# Patient Record
Sex: Female | Born: 1965 | Race: White | Hispanic: No | Smoking: Never smoker
Health system: Southern US, Community
[De-identification: ages and names within clinical notes are randomized; demographics above are authoritative.]

## PROBLEM LIST (undated history)

## (undated) DIAGNOSIS — R112 Nausea with vomiting, unspecified: Secondary | ICD-10-CM

## (undated) DIAGNOSIS — D229 Melanocytic nevi, unspecified: Secondary | ICD-10-CM

## (undated) DIAGNOSIS — D649 Anemia, unspecified: Secondary | ICD-10-CM

## (undated) DIAGNOSIS — M797 Fibromyalgia: Secondary | ICD-10-CM

## (undated) HISTORY — DX: Anemia, unspecified: D64.9

## (undated) HISTORY — DX: Fibromyalgia: M79.7

## (undated) HISTORY — PX: TYMPANOSTOMY TUBE PLACEMENT: SHX32

## (undated) HISTORY — DX: Other specified postprocedural states: R11.2

## (undated) HISTORY — PX: APPENDECTOMY: SHX54

---
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
## 1999-10-17 HISTORY — PX: CERVICAL CONE BIOPSY: SUR198

## 1999-11-07 ENCOUNTER — Encounter: Payer: Self-pay | Admitting: Internal Medicine

## 1999-11-07 ENCOUNTER — Ambulatory Visit (HOSPITAL_COMMUNITY): Admission: RE | Admit: 1999-11-07 | Discharge: 1999-11-07 | Payer: Self-pay | Admitting: Internal Medicine

## 2000-06-04 ENCOUNTER — Other Ambulatory Visit: Admission: RE | Admit: 2000-06-04 | Discharge: 2000-06-04 | Payer: Self-pay | Admitting: Obstetrics and Gynecology

## 2000-06-04 ENCOUNTER — Encounter (INDEPENDENT_AMBULATORY_CARE_PROVIDER_SITE_OTHER): Payer: Self-pay

## 2000-07-05 ENCOUNTER — Ambulatory Visit (HOSPITAL_COMMUNITY): Admission: RE | Admit: 2000-07-05 | Discharge: 2000-07-05 | Payer: Self-pay | Admitting: Obstetrics and Gynecology

## 2000-07-05 ENCOUNTER — Encounter (INDEPENDENT_AMBULATORY_CARE_PROVIDER_SITE_OTHER): Payer: Self-pay | Admitting: Specialist

## 2001-03-06 ENCOUNTER — Other Ambulatory Visit: Admission: RE | Admit: 2001-03-06 | Discharge: 2001-03-06 | Payer: Self-pay | Admitting: Obstetrics and Gynecology

## 2001-03-06 ENCOUNTER — Encounter (INDEPENDENT_AMBULATORY_CARE_PROVIDER_SITE_OTHER): Payer: Self-pay | Admitting: Specialist

## 2002-06-09 ENCOUNTER — Ambulatory Visit (HOSPITAL_COMMUNITY): Admission: RE | Admit: 2002-06-09 | Discharge: 2002-06-09 | Payer: Self-pay | Admitting: Internal Medicine

## 2002-06-09 ENCOUNTER — Encounter: Payer: Self-pay | Admitting: Internal Medicine

## 2002-06-10 ENCOUNTER — Observation Stay (HOSPITAL_COMMUNITY): Admission: EM | Admit: 2002-06-10 | Discharge: 2002-06-11 | Payer: Self-pay | Admitting: Emergency Medicine

## 2002-06-10 ENCOUNTER — Encounter (INDEPENDENT_AMBULATORY_CARE_PROVIDER_SITE_OTHER): Payer: Self-pay

## 2008-02-28 LAB — CONVERTED CEMR LAB: Pap Smear: NORMAL

## 2008-03-06 ENCOUNTER — Ambulatory Visit: Payer: Self-pay | Admitting: Internal Medicine

## 2008-03-06 DIAGNOSIS — M7710 Lateral epicondylitis, unspecified elbow: Secondary | ICD-10-CM | POA: Insufficient documentation

## 2008-05-27 ENCOUNTER — Encounter: Payer: Self-pay | Admitting: Internal Medicine

## 2008-07-27 ENCOUNTER — Encounter: Admission: RE | Admit: 2008-07-27 | Discharge: 2008-07-27 | Payer: Self-pay | Admitting: Obstetrics and Gynecology

## 2009-02-13 LAB — CONVERTED CEMR LAB: Pap Smear: NORMAL

## 2009-07-19 ENCOUNTER — Encounter: Admission: RE | Admit: 2009-07-19 | Discharge: 2009-07-19 | Payer: Self-pay | Admitting: Obstetrics and Gynecology

## 2010-01-31 ENCOUNTER — Telehealth: Payer: Self-pay | Admitting: Internal Medicine

## 2010-02-01 ENCOUNTER — Ambulatory Visit: Payer: Self-pay | Admitting: Internal Medicine

## 2010-02-10 ENCOUNTER — Encounter: Admission: RE | Admit: 2010-02-10 | Discharge: 2010-02-10 | Payer: Self-pay | Admitting: Internal Medicine

## 2010-02-14 ENCOUNTER — Telehealth: Payer: Self-pay | Admitting: Internal Medicine

## 2010-11-15 NOTE — Assessment & Plan Note (Signed)
Summary: chest pain on the right side moved to back and then stomach-lb   Vital Signs:  Patient profile:   45 year old female Height:      63 inches (160.02 cm) Weight:      115.25 pounds (52.39 kg) BMI:     20.49 O2 Sat:      98 % on Room air Temp:     98.3 degrees F (36.83 degrees C) oral Pulse rate:   83 / minute Pulse rhythm:   regular BP sitting:   110 / 64  (left arm) Cuff size:   regular  Vitals Entered By: Brenton Grills (February 01, 2010 4:35 PM)  O2 Flow:  Room air CC: pt states she had upper back pain and chest tightening last Friday, no chest pain, upper back pain 7/10, pt also c/o upper abdominal pain/also wants to discuss other problems/aj   Primary Care Provider:  [facility]  CC:  pt states she had upper back pain and chest tightening last Friday, no chest pain, upper back pain 7/10, and pt also c/o upper abdominal pain/also wants to discuss other problems/aj.  History of Present Illness: Friday had pain on the right of her chest which then later radiated to her back accompanied by nausea. This lasted all day. No appetite. Saturday sick all day: with buring pain in the epigastric. No left chest, no diaphoresis, no SOB. No fatty food intolerance. She did have light/mustard colored stools. No exertional chest pain. She did have a similar bout of discomfort 7 years ago.   Current Medications (verified): 1)  Ortho-Cyclen (28) 0.25-35 Mg-Mcg  Tabs (Norgestimate-Eth Estradiol) .... As Directed  Allergies (verified): 1)  ! Macrobid  Past History:  Past Medical History: Last updated: 03/06/2008 Porfirio Mylar  Past Surgical History: Last updated: 03/06/2008 Appendectomy Tympanostomy tubes as child conization-for cervical atypia '01  Family History: Last updated: 03/06/2008 father- '45: HTN mother - '47: Lipids Neg- breast or colon cancer, DM, CAD  Social History: Last updated: 03/06/2008 HSG married '87 - 10 years divorced 2 daughters - '88, '94 work: Product/process development scientist  for [employer]  Risk Factors: Caffeine Use: 1 (03/06/2008) Exercise: yes (03/06/2008)  Review of Systems       The patient complains of abdominal pain.  The patient denies anorexia, fever, weight loss, weight gain, decreased hearing, hoarseness, chest pain, dyspnea on exertion, prolonged cough, melena, incontinence, difficulty walking, and enlarged lymph nodes.    Physical Exam  General:  Slender white female in no distress Head:  normocephalic and atraumatic.   Eyes:  corneas and lenses clear and no injection.  No jaundice Neck:  supple.   Lungs:  Normal respiratory effort, chest expands symmetrically. Lungs are clear to auscultation, no crackles or wheezes. Heart:  Normal rate and regular rhythm. S1 and S2 normal without gallop, murmur, click, rub or other extra sounds. Abdomen:  soft, non-tender, normal bowel sounds, no distention, no guarding, no rigidity, and no hepatomegaly.  No palpable GB bulb.   Impression & Recommendations:  Problem # 1:  ABDOMINAL PAIN RIGHT UPPER QUADRANT (ICD-789.01)  Patient reassured that this does not sound like cardiac symptoms. I am concerned for biliary colic: female, 40's, right location and radiation; had some mustard stools.  Plan - GB U/S  Orders: Radiology Referral (Radiology)  Complete Medication List: 1)  Ortho-cyclen (28) 0.25-35 Mg-mcg Tabs (Norgestimate-eth estradiol) .... As directed   Preventive Care Screening  Mammogram:    Date:  11/16/2009    Results:  normal   Pap Smear:    Date:  02/13/2009    Results:  normal     Immunization History:  Tetanus/Td Immunization History:    Tetanus/Td:  historical (10/16/2002)

## 2010-11-15 NOTE — Progress Notes (Signed)
Summary: NEEDS APT  Phone Note Call from Patient Call back at [phone]   Summary of Call: Patient is requesting a call back regarding upper back pain and abd pain. Wants to know if she needs office visit.  Initial call taken by: Lamar Sprinkles, CMA,  January 31, 2010 9:32 AM  Follow-up for Phone Call        Pt c/o right sided upper abd pain which started friday night. Pain resolved and pt became nauseated and then had diarrhea. Continues to have some loose stool, approx 2 daily but continues to get better.  Advised bland diet and office visit for eval if she did not continue to improve.  Follow-up by: Lamar Sprinkles, CMA,  January 31, 2010 2:35 PM  Additional Follow-up for Phone Call Additional follow up Details #1::        ov if she doesn't get better Additional Follow-up by: Jacques Navy MD,  January 31, 2010 5:21 PM

## 2010-11-15 NOTE — Progress Notes (Signed)
  Phone Note Outgoing Call   Reason for Call: Discuss lab or test results Summary of Call: please call: normal Abdominal U/S  Thaks Initial call taken by: Jacques Navy MD,  Feb 14, 2010 5:16 AM  Follow-up for Phone Call        lmoam for pt to return call Follow-up by: Ami Bullins CMA,  Feb 14, 2010 3:08 PM  Additional Follow-up for Phone Call Additional follow up Details #1::        informed pt of results Additional Follow-up by: Ami Bullins CMA,  Feb 15, 2010 8:59 AM

## 2011-03-03 NOTE — Op Note (Signed)
Melinda Wilkinson, Melinda Wilkinson NO.:  [id number]   MEDICAL RECORD NO.:  [id number]                   PATIENT TYPE:  OBV   LOCATION:  0357                                 FACILITY:  [facility]   PHYSICIAN:  Skeet Simmer., M.D.         DATE OF BIRTH:  [birth year]   DATE OF PROCEDURE:  06/10/2002  DATE OF DISCHARGE:  06/11/2002                                 OPERATIVE REPORT   PREOPERATIVE DIAGNOSIS:  Acute appendicitis.   POSTOPERATIVE DIAGNOSIS:  Acute appendicitis.   OPERATION:  Laparoscopic appendectomy.   SURGEON:  Zigmund Daniel, M.D.   ANESTHESIA:  General.   CLINICAL NOTE:  The patient is a 45 year old white female, who had a two day  history of generalized abdominal pain localizing to the right lower  quadrant.  Although her white count was normal and she did not have fever,  she was tender in the right lower quadrant, and so a CT scan was done  showing findings typical for acute appendicitis.  The patient agreed to have  a laparoscopic appendectomy.   DESCRIPTION OF PROCEDURE:  After the patient was monitored and anesthetized,  had a Foley catheter and routine preparation and draping of the abdomen, I  anesthetized port sites just below the umbilicus in the lower midline a few  centimeters below that and in the right upper quadrant.  I put in a Hasson  cannula through a small incision just below the umbilicus after opening the  fascia in the midline and bluntly entering the peritoneal cavity.  I put in  a 10/11 mm port in the lower midline and a 5 mm port in the right upper  quadrant.  There was a small amount of bloody fluid present in the right  lower quadrant.  I could see the base of the appendix and noted that the tip  of the appendix and actually most of the appendix was in the retrocecal,  retroperitoneal position.  I incised the peritoneum near it and dissected it  free and noted that it was acutely inflamed.  It was healthy at the  base.  The mesoappendix and lateral attachments were fairly broad-based and  required a good deal of dissection to bring down.  I stapled the  mesoappendix and the base of the appendix with separate firings of the  endoscopic stapler.  I was not quite sure that I completely stapled across  the appendix at the cecum, and so I fired an extra load of the stapler, and  then there was obviously secure amputation of the appendix.  I placed the  appendix into a plastic pouch and put it in the pelvis.  I then irrigated  the right lower quadrant and removed the irrigant.  I found a couple of  small bleeders and cauterized those and thereafter hemostasis was good.  After removing as much irrigant as I could, I removed the appendix through  the  umbilical incision and tied the pursestring suture.  I then removed the  right upper quadrant port under direct vision and removed the lower midline  port after allowing the CO2 to escape.  The patient tolerated the operation  well.  I closed the skin incisions with intracuticular 4-0 Vicryl and Steri-  Strips.                                               Skeet Simmer., M.D.    Elvis Coil  D:  06/10/2002  T:  06/12/2002  Job:  [id number]

## 2011-03-03 NOTE — Op Note (Signed)
[facility]  Patient:    VALENA, IVANOV                       MRN: [id number] Proc. Date: 07/05/00 Adm. Date:  [id number] Disc. Date: [id number] Attending:  Lendon Colonel                           Operative Report  PREOPERATIVE DIAGNOSIS:       Carcinoma in situ of cervix.  POSTOPERATIVE DIAGNOSIS:      Carcinoma in situ of cervix.  OPERATION:                    Cold knife conization.  DESCRIPTION OF PROCEDURE:     The patient was placed in lithotomy position and prepped and draped in the usual fashion.  Cervix grasped with a tenaculum, injected with about 10 cc of 0.5% Xylocaine with epinephrine.  Two lateral hemostatic sutures were placed in the cervix and a wide, sharp conization of the cervix was obtained.  The cervix was then closed with interrupted sutures of 0 Vicryl.  An endocervical curettage had been accomplished after the conization was removed.  Hemostasis was secure.  Patty tolerated this procedure well and was sent to recovery in good condition. DD:  07/05/00 TD:  07/07/00 Job: [id number]

## 2011-04-07 ENCOUNTER — Encounter: Payer: Self-pay | Admitting: Internal Medicine

## 2011-04-07 ENCOUNTER — Ambulatory Visit (INDEPENDENT_AMBULATORY_CARE_PROVIDER_SITE_OTHER): Payer: Managed Care, Other (non HMO) | Admitting: Internal Medicine

## 2011-04-07 VITALS — BP 128/78 | HR 99 | Temp 98.6°F | Wt 116.0 lb

## 2011-04-07 DIAGNOSIS — R1013 Epigastric pain: Secondary | ICD-10-CM

## 2011-04-07 NOTE — Progress Notes (Signed)
  Subjective:    Patient ID: Melinda Wilkinson, female    DOB: 06/13/1966, 45 y.o.   MRN: [id number]  HPI Ms. Abramo presents for a month long h/o abdominal pain n the upper abdomen with some radiation to the back. Her symptoms are exacerbated by eating. In the last 24 hours she has had an episode of increased pain,a change in radiation to the upper back, chills, nauseas and flu like feeling. She had an episode about a year ago that was similar. She had a GI workup with GB u/s that was negative for stone or other abnormality. No change in bowel habit or stools. She has not taken any otc medications for her discomfort.   No past medical history on file. Past Surgical History  Procedure Date  . Appendectomy   . Tympanostomy tube placement as child  . Cervical cone biopsy 2001    for cervical atypia   Family History  Problem Relation Age of Onset  . Hyperlipidemia Mother   . Hypertension Father   . Cancer Neg Hx     breast or colon  . Diabetes Neg Hx   . Coronary artery disease Neg Hx    History   Social History  . Marital Status: Married    Spouse Name: N/A    Number of Children: N/A  . Years of Education: N/A   Occupational History  . auditor [employer]   Social History Main Topics  . Smoking status: Not on file  . Smokeless tobacco: Not on file  . Alcohol Use:   . Drug Use:   . Sexually Active:    Other Topics Concern  . Not on file   Social History Narrative   HSGMarried [id number] 10 yrs divorced       Review of Systems  Constitutional: Negative.   HENT: Negative.   Respiratory: Negative.   Cardiovascular: Negative.   Gastrointestinal: Positive for abdominal pain. Negative for vomiting, diarrhea, constipation and blood in stool.  Genitourinary: Negative.   Musculoskeletal: Negative.   Psychiatric/Behavioral: Negative.        Objective:   Physical Exam Vital noted Gen'l- WNWD woman in no distress Abdomen - no tenderness RUQ to palpation or percussion.  Tender int he epigastrum. No lower quadrant tenderness       Assessment & Plan:  Abdominal pain - previous work-up negative for GB disease. Symptoms do not suggest biliary colic but are c/w acid related dyspepsia.  Plan - trial of PPI therapy - samples of nexium provided. She is to report back as to effect - Rx will be provided if helpful

## 2011-04-10 ENCOUNTER — Ambulatory Visit: Payer: Self-pay | Admitting: Internal Medicine

## 2011-11-16 ENCOUNTER — Other Ambulatory Visit: Payer: Self-pay | Admitting: Family Medicine

## 2011-11-16 ENCOUNTER — Other Ambulatory Visit (HOSPITAL_COMMUNITY)
Admission: RE | Admit: 2011-11-16 | Discharge: 2011-11-16 | Disposition: A | Discharge status: Home / Self Care | Payer: Managed Care, Other (non HMO) | Source: Ambulatory Visit | Attending: Family Medicine | Admitting: Family Medicine

## 2011-11-16 DIAGNOSIS — Z124 Encounter for screening for malignant neoplasm of cervix: Secondary | ICD-10-CM | POA: Insufficient documentation

## 2011-11-16 DIAGNOSIS — Z1159 Encounter for screening for other viral diseases: Secondary | ICD-10-CM | POA: Insufficient documentation

## 2012-04-30 ENCOUNTER — Emergency Department (HOSPITAL_BASED_OUTPATIENT_CLINIC_OR_DEPARTMENT_OTHER)
Admission: EM | Admit: 2012-04-30 | Discharge: 2012-04-30 | Disposition: A | Discharge status: Home / Self Care | Payer: Managed Care, Other (non HMO) | Attending: Emergency Medicine | Admitting: Emergency Medicine

## 2012-04-30 ENCOUNTER — Emergency Department (HOSPITAL_BASED_OUTPATIENT_CLINIC_OR_DEPARTMENT_OTHER): Payer: Managed Care, Other (non HMO)

## 2012-04-30 ENCOUNTER — Encounter (HOSPITAL_BASED_OUTPATIENT_CLINIC_OR_DEPARTMENT_OTHER): Payer: Self-pay | Admitting: Family Medicine

## 2012-04-30 DIAGNOSIS — R102 Pelvic and perineal pain: Secondary | ICD-10-CM

## 2012-04-30 DIAGNOSIS — N949 Unspecified condition associated with female genital organs and menstrual cycle: Secondary | ICD-10-CM | POA: Insufficient documentation

## 2012-04-30 DIAGNOSIS — R1032 Left lower quadrant pain: Secondary | ICD-10-CM | POA: Insufficient documentation

## 2012-04-30 LAB — CBC WITH DIFFERENTIAL/PLATELET
Basophils Absolute: 0 10*3/uL (ref 0.0–0.1)
Eosinophils Absolute: 0.1 10*3/uL (ref 0.0–0.7)
Eosinophils Relative: 1 % (ref 0–5)
Lymphocytes Relative: 19 % (ref 12–46)
Lymphs Abs: 1.1 10*3/uL (ref 0.7–4.0)
MCH: 30.1 pg (ref 26.0–34.0)
MCV: 86.9 fL (ref 78.0–100.0)
Neutrophils Relative %: 74 % (ref 43–77)
Platelets: 260 10*3/uL (ref 150–400)
RBC: 3.75 MIL/uL — ABNORMAL LOW (ref 3.87–5.11)
RDW: 13.1 % (ref 11.5–15.5)
WBC: 5.9 10*3/uL (ref 4.0–10.5)

## 2012-04-30 LAB — URINALYSIS, ROUTINE W REFLEX MICROSCOPIC
Bilirubin Urine: NEGATIVE
Leukocytes, UA: NEGATIVE
Nitrite: NEGATIVE
Specific Gravity, Urine: 1.021 (ref 1.005–1.030)
Urobilinogen, UA: 0.2 mg/dL (ref 0.0–1.0)
pH: 6 (ref 5.0–8.0)

## 2012-04-30 LAB — BASIC METABOLIC PANEL
Calcium: 9.3 mg/dL (ref 8.4–10.5)
GFR calc non Af Amer: >90 mL/min (ref >90)
Potassium: 4 mEq/L (ref 3.5–5.1)
Sodium: 138 mEq/L (ref 135–145)

## 2012-04-30 LAB — URINE MICROSCOPIC-ADD ON

## 2012-04-30 LAB — WET PREP, GENITAL
Clue Cells Wet Prep HPF POC: NONE SEEN
Trich, Wet Prep: NONE SEEN
Yeast Wet Prep HPF POC: NONE SEEN

## 2012-04-30 MED ORDER — OXYCODONE-ACETAMINOPHEN 5-325 MG PO TABS: 1.0000 | ORAL_TABLET | ORAL | Status: AC | PRN

## 2012-04-30 MED ORDER — KETOROLAC TROMETHAMINE 30 MG/ML IJ SOLN
30.0000 mg | Freq: Once | INTRAMUSCULAR | Status: AC
  Administered 2012-04-30: 30 mg via INTRAVENOUS
  Filled 2012-04-30: qty 1

## 2012-04-30 MED ORDER — NAPROXEN 500 MG PO TABS: 500.0000 mg | ORAL_TABLET | Freq: Two times a day (BID) | ORAL | Status: AC

## 2012-04-30 NOTE — ED Provider Notes (Signed)
History     CSN: [id number]  Arrival date & time 04/30/12  4098   First MD Initiated Contact with Patient 04/30/12 1002      Chief Complaint  Patient presents with  . Abdominal Pain    (Consider location/radiation/quality/duration/timing/severity/associated sxs/prior treatment) Patient is a 46 y.o. female presenting with abdominal pain. The history is provided by the patient.  Abdominal Pain The primary symptoms of the illness include abdominal pain.  She has been having left suprapubic pain for the last 2 months. Pain waxes and wanes and at times is completely gone. There is intermittent radiation to her lower back. Pain is worse if she flexes her hip joints and better she lays still. Is also better in the middle of the day and tends to be worse at night. It seems to be worse at around the time of her menstrual period. Her menses started yesterday, and last night, pain was severe. She rated pain at 10/10 last night, but it has improved to and is only 4/10 currently. She is treated herself with ibuprofen which does give partial relief. They're sometimes associated nausea, but she has not had any vomiting. She denies fever, chills, sweats. She denies constipation or diarrhea. She's not had any urinary difficulty and she denies any vaginal discharge. She is on birth control pills for contraception.  History reviewed. No pertinent past medical history.  Past Surgical History  Procedure Date  . Appendectomy   . Tympanostomy tube placement as child  . Cervical cone biopsy 2001    for cervical atypia    Family History  Problem Relation Age of Onset  . Hyperlipidemia Mother   . Hypertension Father   . Cancer Neg Hx     breast or colon  . Diabetes Neg Hx   . Coronary artery disease Neg Hx     History  Substance Use Topics  . Smoking status: Never Smoker   . Smokeless tobacco: Not on file  . Alcohol Use: No    OB History    Grav Para Term Preterm Abortions TAB SAB Ect Mult  Living                  Review of Systems  Gastrointestinal: Positive for abdominal pain.  All other systems reviewed and are negative.    Allergies  Nitrofurantoin  Home Medications   Current Outpatient Rx  Name Route Sig Dispense Refill  . NORGESTIMATE-ETH ESTRADIOL 0.25-35 MG-MCG PO TABS Oral Take 1 tablet by mouth daily.        BP 126/86  Pulse 113  Temp 98.5 F (36.9 C) (Oral)  Resp 18  Ht 5\' 3"  (1.6 m)  Wt 119 lb (53.978 kg)  BMI 21.08 kg/m2  SpO2 100%  LMP 04/29/2012  Physical Exam  Nursing note and vitals reviewed.  46 year old female who is resting comfortably and in no acute distress. Vital signs are significant for tachycardia with heart rate 113. Oxygen saturation is 100% which is normal. Head is normocephalic and atraumatic. PERRLA, EOMI. Oropharynx is clear. Neck is nontender and supple. Back is nontender there's no CVA tenderness. Lungs are clear without rales, wheezes, rhonchi. Heart has regular rate and rhythm without murmur. Abdomen is soft, flat, with mild left suprapubic tenderness. There is no rebound or guarding. There no masses or hepatosplenomegaly. Peristalsis is normal active. Pelvic exam: Normal external female genitalia, small amount of menstrual flow present but no other discharge, fundus is normal size and position and nontender, there is no adnexal  masses, there is mild tenderness to palpation of the left adnexa, there is no cervical motion tenderness. Extremities have no cyanosis or edema, full range of motion is present. Skin is warm and dry without rash. Neurologic: Mental status is normal, cranial nerves are intact, there are no motor or sensory deficits.  ED Course  Procedures (including critical care time)  Results for orders placed during the hospital encounter of 04/30/12  URINALYSIS, ROUTINE W REFLEX MICROSCOPIC      Component Value Range   Color, Urine YELLOW  YELLOW   APPearance CLEAR  CLEAR   Specific Gravity, Urine 1.021  1.005 -  1.030   pH 6.0  5.0 - 8.0   Glucose, UA NEGATIVE  NEGATIVE mg/dL   Hgb urine dipstick LARGE (*) NEGATIVE   Bilirubin Urine NEGATIVE  NEGATIVE   Ketones, ur NEGATIVE  NEGATIVE mg/dL   Protein, ur NEGATIVE  NEGATIVE mg/dL   Urobilinogen, UA 0.2  0.0 - 1.0 mg/dL   Nitrite NEGATIVE  NEGATIVE   Leukocytes, UA NEGATIVE  NEGATIVE  PREGNANCY, URINE      Component Value Range   Preg Test, Ur NEGATIVE  NEGATIVE  CBC WITH DIFFERENTIAL      Component Value Range   WBC 5.9  4.0 - 10.5 K/uL   RBC 3.75 (*) 3.87 - 5.11 MIL/uL   Hemoglobin 11.3 (*) 12.0 - 15.0 g/dL   HCT 16.1 (*) 09.6 - 04.5 %   MCV 86.9  78.0 - 100.0 fL   MCH 30.1  26.0 - 34.0 pg   MCHC 34.7  30.0 - 36.0 g/dL   RDW 40.9  81.1 - 91.4 %   Platelets 260  150 - 400 K/uL   Neutrophils Relative 74  43 - 77 %   Neutro Abs 4.3  1.7 - 7.7 K/uL   Lymphocytes Relative 19  12 - 46 %   Lymphs Abs 1.1  0.7 - 4.0 K/uL   Monocytes Relative 6  3 - 12 %   Monocytes Absolute 0.4  0.1 - 1.0 K/uL   Eosinophils Relative 1  0 - 5 %   Eosinophils Absolute 0.1  0.0 - 0.7 K/uL   Basophils Relative 0  0 - 1 %   Basophils Absolute 0.0  0.0 - 0.1 K/uL  BASIC METABOLIC PANEL      Component Value Range   Sodium 138  135 - 145 mEq/L   Potassium 4.0  3.5 - 5.1 mEq/L   Chloride 103  96 - 112 mEq/L   CO2 25  19 - 32 mEq/L   Glucose, Bld 108 (*) 70 - 99 mg/dL   BUN 10  6 - 23 mg/dL   Creatinine, Ser 7.82  0.50 - 1.10 mg/dL   Calcium 9.3  8.4 - 95.6 mg/dL   GFR calc non Af Amer >90  >90 mL/min   GFR calc Af Amer >90  >90 mL/min  WET PREP, GENITAL      Component Value Range   Yeast Wet Prep HPF POC NONE SEEN  NONE SEEN   Trich, Wet Prep NONE SEEN  NONE SEEN   Clue Cells Wet Prep HPF POC NONE SEEN  NONE SEEN   WBC, Wet Prep HPF POC FEW (*) NONE SEEN  URINE MICROSCOPIC-ADD ON      Component Value Range   Squamous Epithelial / LPF FEW (*) RARE   WBC, UA 0-2  <3 WBC/hpf   RBC / HPF TOO NUMEROUS TO COUNT  <3 RBC/hpf  Bacteria, UA FEW (*) RARE   US  Transvaginal Non-ob  04/30/2012  *RADIOLOGY REPORT*  Clinical Data: Left lower quadrant pain  TRANSABDOMINAL AND TRANSVAGINAL ULTRASOUND OF PELVIS Technique:  Both transabdominal and transvaginal ultrasound examinations of the pelvis were performed. Transabdominal technique was performed for global imaging of the pelvis including uterus, ovaries, adnexal regions, and pelvic cul-de-sac.  It was necessary to proceed with endovaginal exam following the transabdominal exam to visualize the adnexa.  Comparison:  None  Findings:  The uterus is normal in size and echotexture, measuring 9.6 x 4.8 x 7.0 cm.  The endometrial stripe is thin and homogeneous, measuring 6 mm in width.  Both ovaries have a normal size and appearance with the right ovary measuring 2.3 x 1.7 x 2.0 cm, and the left ovary measuring 2.1 x 2.1 x 2.3 cm.  There are no adnexal masses or free pelvic fluid.  IMPRESSION: Normal study. No evidence of pelvic mass or other significant abnormality.  Original Report Authenticated By: Brandon Melnick, M.D.   US Pelvis Complete  04/30/2012  *RADIOLOGY REPORT*  Clinical Data: Left lower quadrant pain  TRANSABDOMINAL AND TRANSVAGINAL ULTRASOUND OF PELVIS Technique:  Both transabdominal and transvaginal ultrasound examinations of the pelvis were performed. Transabdominal technique was performed for global imaging of the pelvis including uterus, ovaries, adnexal regions, and pelvic cul-de-sac.  It was necessary to proceed with endovaginal exam following the transabdominal exam to visualize the adnexa.  Comparison:  None  Findings:  The uterus is normal in size and echotexture, measuring 9.6 x 4.8 x 7.0 cm.  The endometrial stripe is thin and homogeneous, measuring 6 mm in width.  Both ovaries have a normal size and appearance with the right ovary measuring 2.3 x 1.7 x 2.0 cm, and the left ovary measuring 2.1 x 2.1 x 2.3 cm.  There are no adnexal masses or free pelvic fluid.  IMPRESSION: Normal study. No evidence of  pelvic mass or other significant abnormality.  Original Report Authenticated By: Brandon Melnick, M.D.      1. Pelvic pain in female       MDM  Left pelvic pain in a pattern that seems more suggestive of endometriosis and ovarian cyst. Pelvic call ultrasound will be obtained.  Ultrasound is unremarkable. Chronic given the lack of findings on ultrasound, and a worsening of symptoms around her menses, I feel she most likely has endometriosis. She is started on birth control pills which should be helpful. She does have a gynecologist. She is encouraged to followup with her oncologist and she's given prescriptions for naproxen and Percocet.      Dione Booze, MD 04/30/12 [phone]

## 2012-04-30 NOTE — ED Notes (Signed)
Pt c/o "left ovary pain" x 2 months. Pt sts pain is always worse when menstruating. Pt sts pain is also worse with "some movements". Pt denies n/v/d. Pt sts last bowel movement last night and normal. Pt sts she is on day 2 of menses.

## 2013-10-22 ENCOUNTER — Other Ambulatory Visit: Payer: Self-pay

## 2013-10-22 DIAGNOSIS — Z1231 Encounter for screening mammogram for malignant neoplasm of breast: Secondary | ICD-10-CM

## 2013-11-03 ENCOUNTER — Ambulatory Visit
Admission: RE | Admit: 2013-11-03 | Discharge: 2013-11-03 | Disposition: A | Discharge status: Home / Self Care | Payer: Managed Care, Other (non HMO) | Source: Ambulatory Visit

## 2013-11-03 DIAGNOSIS — Z1231 Encounter for screening mammogram for malignant neoplasm of breast: Secondary | ICD-10-CM

## 2014-06-04 IMAGING — MG MM DIGITAL SCREENING BILAT W/ CAD
5 series · 5 of 5 positions shown · non-contrast
Comparison: Previous exam(s).

CLINICAL DATA: Screening.

EXAM:
DIGITAL SCREENING BILATERAL MAMMOGRAM WITH CAD

[R CC]
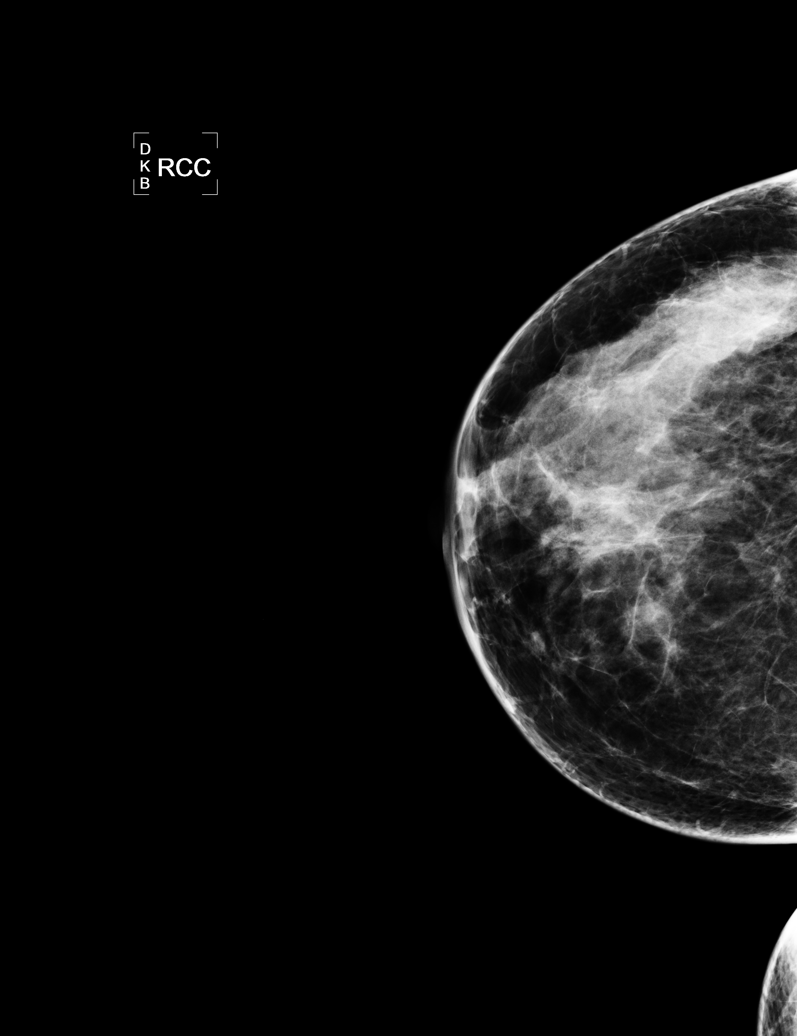

[L CC]
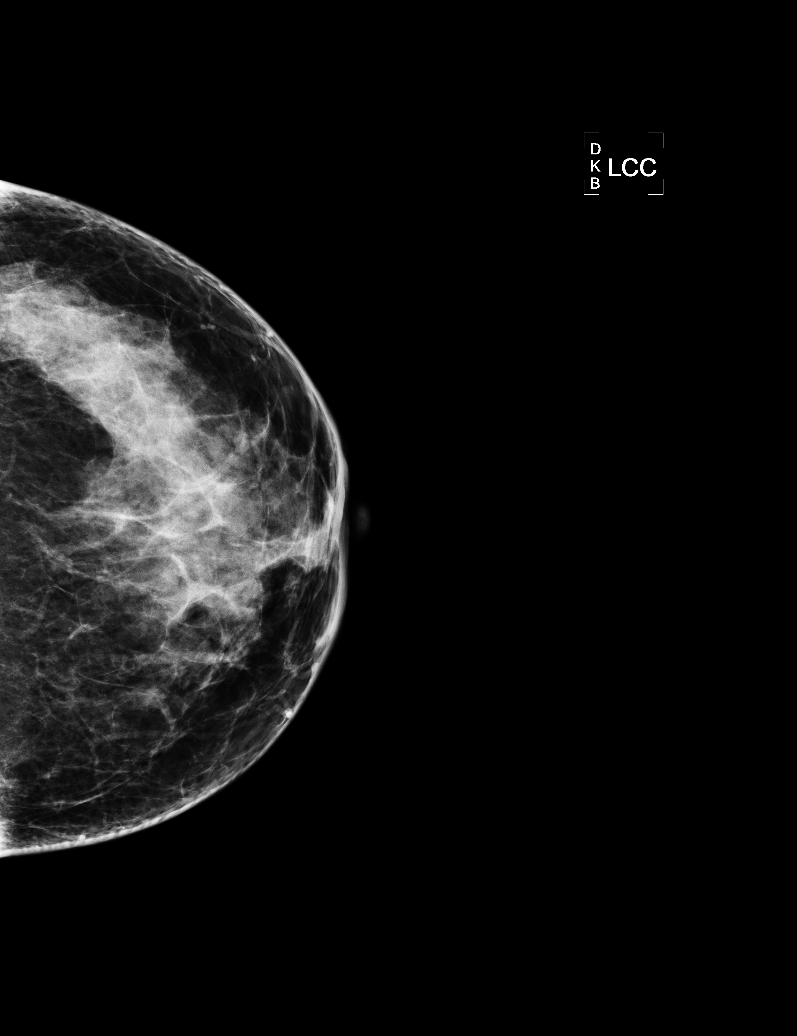

[L MLO]
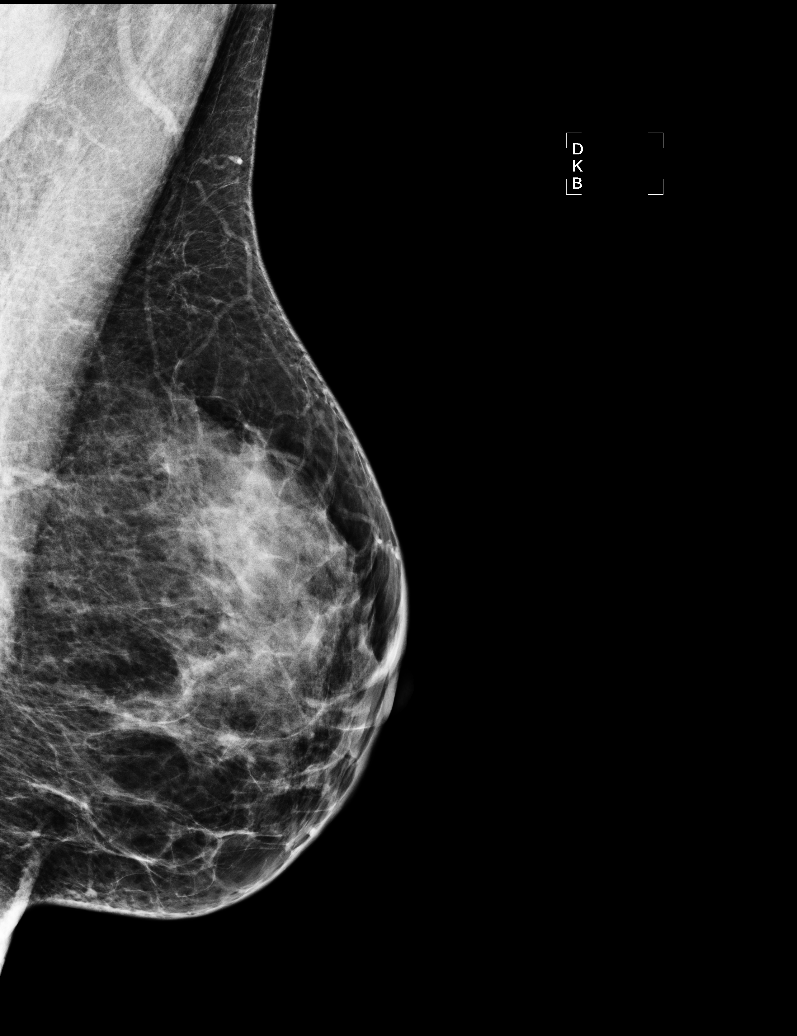

[R MLO]
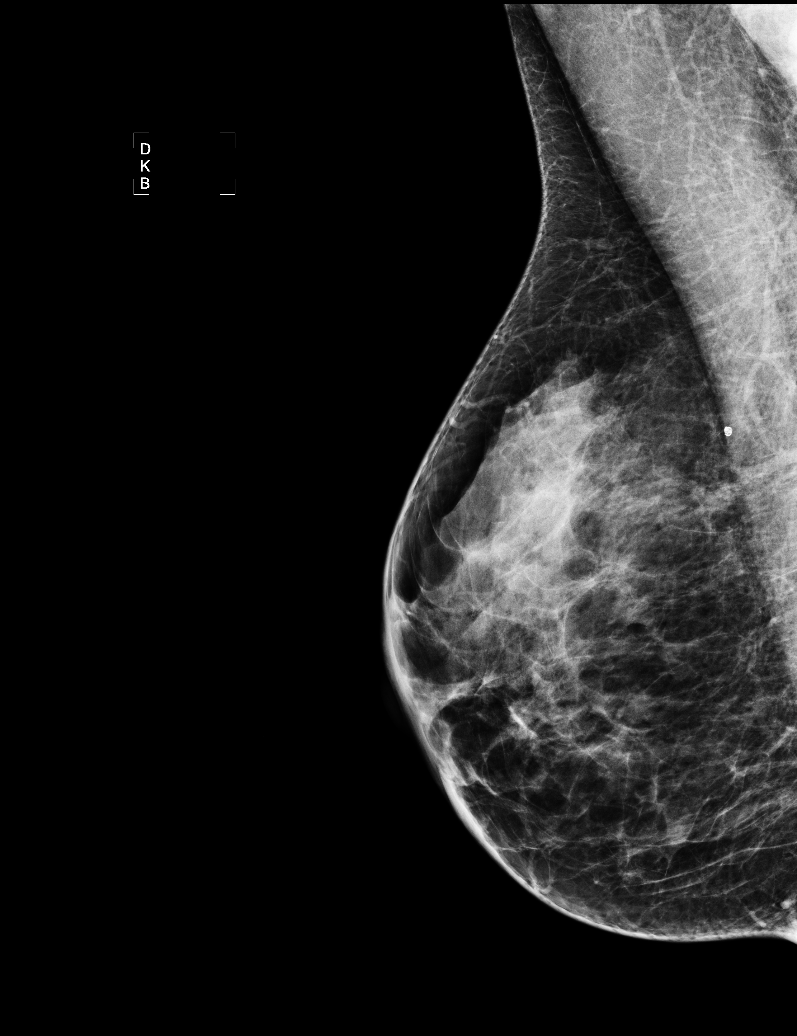

[R XCCL]
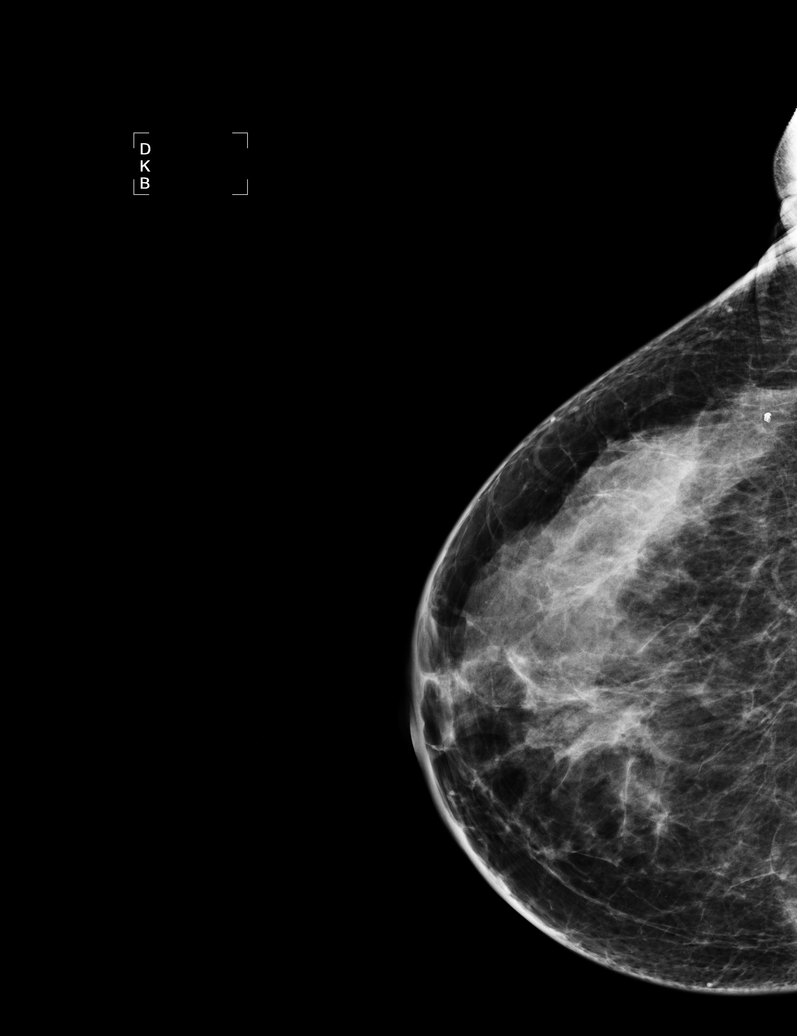

[5 of 5 positions shown; findings below may reference images not displayed]

ACR Breast Density Category c: The breast tissue is heterogeneously
dense, which may obscure small masses.
FINDINGS: There are no findings suspicious for malignancy. Images were
processed with CAD.
IMPRESSION: No mammographic evidence of malignancy. A result letter of this
screening mammogram will be mailed directly to the patient.

RECOMMENDATION:
Screening mammogram in one year. (Code:[id number]-[id number])

BI-RADS CATEGORY  1: Negative

## 2015-02-12 ENCOUNTER — Other Ambulatory Visit: Payer: Self-pay | Admitting: Obstetrics & Gynecology

## 2015-02-16 LAB — CYTOLOGY - PAP

## 2016-02-24 DIAGNOSIS — Z1231 Encounter for screening mammogram for malignant neoplasm of breast: Secondary | ICD-10-CM | POA: Diagnosis not present

## 2016-02-24 DIAGNOSIS — Z01419 Encounter for gynecological examination (general) (routine) without abnormal findings: Secondary | ICD-10-CM | POA: Diagnosis not present

## 2016-02-24 DIAGNOSIS — Z6820 Body mass index (BMI) 20.0-20.9, adult: Secondary | ICD-10-CM | POA: Diagnosis not present

## 2016-08-17 ENCOUNTER — Other Ambulatory Visit: Payer: Self-pay

## 2016-08-17 DIAGNOSIS — B351 Tinea unguium: Secondary | ICD-10-CM | POA: Diagnosis not present

## 2016-08-17 DIAGNOSIS — D485 Neoplasm of uncertain behavior of skin: Secondary | ICD-10-CM | POA: Diagnosis not present

## 2016-08-17 DIAGNOSIS — D229 Melanocytic nevi, unspecified: Secondary | ICD-10-CM

## 2016-08-17 DIAGNOSIS — L814 Other melanin hyperpigmentation: Secondary | ICD-10-CM | POA: Diagnosis not present

## 2016-08-17 DIAGNOSIS — D239 Other benign neoplasm of skin, unspecified: Secondary | ICD-10-CM | POA: Diagnosis not present

## 2016-08-17 HISTORY — DX: Melanocytic nevi, unspecified: D22.9

## 2016-09-12 DIAGNOSIS — J3089 Other allergic rhinitis: Secondary | ICD-10-CM | POA: Diagnosis not present

## 2016-09-12 DIAGNOSIS — J3081 Allergic rhinitis due to animal (cat) (dog) hair and dander: Secondary | ICD-10-CM | POA: Diagnosis not present

## 2016-09-12 DIAGNOSIS — J309 Allergic rhinitis, unspecified: Secondary | ICD-10-CM | POA: Diagnosis not present

## 2016-09-12 DIAGNOSIS — J301 Allergic rhinitis due to pollen: Secondary | ICD-10-CM | POA: Diagnosis not present

## 2016-11-24 DIAGNOSIS — Z5181 Encounter for therapeutic drug level monitoring: Secondary | ICD-10-CM | POA: Diagnosis not present

## 2016-11-24 DIAGNOSIS — E785 Hyperlipidemia, unspecified: Secondary | ICD-10-CM | POA: Diagnosis not present

## 2016-11-24 DIAGNOSIS — Z0000 Encounter for general adult medical examination without abnormal findings: Secondary | ICD-10-CM | POA: Diagnosis not present

## 2016-11-24 DIAGNOSIS — R5383 Other fatigue: Secondary | ICD-10-CM | POA: Diagnosis not present

## 2017-02-04 DIAGNOSIS — H7291 Unspecified perforation of tympanic membrane, right ear: Secondary | ICD-10-CM | POA: Diagnosis not present

## 2017-02-24 DIAGNOSIS — S86892A Other injury of other muscle(s) and tendon(s) at lower leg level, left leg, initial encounter: Secondary | ICD-10-CM | POA: Diagnosis not present

## 2017-04-17 DIAGNOSIS — Z621 Parental overprotection: Secondary | ICD-10-CM | POA: Diagnosis not present

## 2017-04-17 DIAGNOSIS — Z1231 Encounter for screening mammogram for malignant neoplasm of breast: Secondary | ICD-10-CM | POA: Diagnosis not present

## 2017-04-17 DIAGNOSIS — Z01419 Encounter for gynecological examination (general) (routine) without abnormal findings: Secondary | ICD-10-CM | POA: Diagnosis not present

## 2017-07-20 DIAGNOSIS — B029 Zoster without complications: Secondary | ICD-10-CM | POA: Diagnosis not present

## 2017-07-20 DIAGNOSIS — R590 Localized enlarged lymph nodes: Secondary | ICD-10-CM | POA: Diagnosis not present

## 2017-09-19 DIAGNOSIS — J301 Allergic rhinitis due to pollen: Secondary | ICD-10-CM | POA: Diagnosis not present

## 2017-09-19 DIAGNOSIS — T63441A Toxic effect of venom of bees, accidental (unintentional), initial encounter: Secondary | ICD-10-CM | POA: Diagnosis not present

## 2017-09-19 DIAGNOSIS — T63451A Toxic effect of venom of hornets, accidental (unintentional), initial encounter: Secondary | ICD-10-CM | POA: Diagnosis not present

## 2017-09-19 DIAGNOSIS — T63461A Toxic effect of venom of wasps, accidental (unintentional), initial encounter: Secondary | ICD-10-CM | POA: Diagnosis not present

## 2017-10-11 DIAGNOSIS — T63451D Toxic effect of venom of hornets, accidental (unintentional), subsequent encounter: Secondary | ICD-10-CM | POA: Diagnosis not present

## 2017-10-11 DIAGNOSIS — T63461D Toxic effect of venom of wasps, accidental (unintentional), subsequent encounter: Secondary | ICD-10-CM | POA: Diagnosis not present

## 2017-10-11 DIAGNOSIS — T63441D Toxic effect of venom of bees, accidental (unintentional), subsequent encounter: Secondary | ICD-10-CM | POA: Diagnosis not present

## 2017-10-18 DIAGNOSIS — T63461D Toxic effect of venom of wasps, accidental (unintentional), subsequent encounter: Secondary | ICD-10-CM | POA: Diagnosis not present

## 2017-10-18 DIAGNOSIS — T63441D Toxic effect of venom of bees, accidental (unintentional), subsequent encounter: Secondary | ICD-10-CM | POA: Diagnosis not present

## 2017-10-18 DIAGNOSIS — T63451D Toxic effect of venom of hornets, accidental (unintentional), subsequent encounter: Secondary | ICD-10-CM | POA: Diagnosis not present

## 2017-10-25 DIAGNOSIS — T63461D Toxic effect of venom of wasps, accidental (unintentional), subsequent encounter: Secondary | ICD-10-CM | POA: Diagnosis not present

## 2017-10-25 DIAGNOSIS — T63441D Toxic effect of venom of bees, accidental (unintentional), subsequent encounter: Secondary | ICD-10-CM | POA: Diagnosis not present

## 2017-10-25 DIAGNOSIS — T63451D Toxic effect of venom of hornets, accidental (unintentional), subsequent encounter: Secondary | ICD-10-CM | POA: Diagnosis not present

## 2017-11-01 DIAGNOSIS — T63461D Toxic effect of venom of wasps, accidental (unintentional), subsequent encounter: Secondary | ICD-10-CM | POA: Diagnosis not present

## 2017-11-01 DIAGNOSIS — T63451D Toxic effect of venom of hornets, accidental (unintentional), subsequent encounter: Secondary | ICD-10-CM | POA: Diagnosis not present

## 2017-11-01 DIAGNOSIS — T63441D Toxic effect of venom of bees, accidental (unintentional), subsequent encounter: Secondary | ICD-10-CM | POA: Diagnosis not present

## 2017-11-08 DIAGNOSIS — T63441D Toxic effect of venom of bees, accidental (unintentional), subsequent encounter: Secondary | ICD-10-CM | POA: Diagnosis not present

## 2017-11-08 DIAGNOSIS — T63451D Toxic effect of venom of hornets, accidental (unintentional), subsequent encounter: Secondary | ICD-10-CM | POA: Diagnosis not present

## 2017-11-08 DIAGNOSIS — T63461D Toxic effect of venom of wasps, accidental (unintentional), subsequent encounter: Secondary | ICD-10-CM | POA: Diagnosis not present

## 2017-11-15 DIAGNOSIS — T63441D Toxic effect of venom of bees, accidental (unintentional), subsequent encounter: Secondary | ICD-10-CM | POA: Diagnosis not present

## 2017-11-15 DIAGNOSIS — T63451D Toxic effect of venom of hornets, accidental (unintentional), subsequent encounter: Secondary | ICD-10-CM | POA: Diagnosis not present

## 2017-11-15 DIAGNOSIS — T63461D Toxic effect of venom of wasps, accidental (unintentional), subsequent encounter: Secondary | ICD-10-CM | POA: Diagnosis not present

## 2017-11-22 DIAGNOSIS — T63441D Toxic effect of venom of bees, accidental (unintentional), subsequent encounter: Secondary | ICD-10-CM | POA: Diagnosis not present

## 2017-11-22 DIAGNOSIS — T63461D Toxic effect of venom of wasps, accidental (unintentional), subsequent encounter: Secondary | ICD-10-CM | POA: Diagnosis not present

## 2017-11-22 DIAGNOSIS — T63451D Toxic effect of venom of hornets, accidental (unintentional), subsequent encounter: Secondary | ICD-10-CM | POA: Diagnosis not present

## 2017-11-29 DIAGNOSIS — T63451D Toxic effect of venom of hornets, accidental (unintentional), subsequent encounter: Secondary | ICD-10-CM | POA: Diagnosis not present

## 2017-11-29 DIAGNOSIS — T63441D Toxic effect of venom of bees, accidental (unintentional), subsequent encounter: Secondary | ICD-10-CM | POA: Diagnosis not present

## 2017-11-29 DIAGNOSIS — T63461D Toxic effect of venom of wasps, accidental (unintentional), subsequent encounter: Secondary | ICD-10-CM | POA: Diagnosis not present

## 2017-11-30 DIAGNOSIS — I7300 Raynaud's syndrome without gangrene: Secondary | ICD-10-CM | POA: Diagnosis not present

## 2017-11-30 DIAGNOSIS — B351 Tinea unguium: Secondary | ICD-10-CM | POA: Diagnosis not present

## 2017-11-30 DIAGNOSIS — Z0000 Encounter for general adult medical examination without abnormal findings: Secondary | ICD-10-CM | POA: Diagnosis not present

## 2017-11-30 DIAGNOSIS — Z5181 Encounter for therapeutic drug level monitoring: Secondary | ICD-10-CM | POA: Diagnosis not present

## 2017-11-30 DIAGNOSIS — E785 Hyperlipidemia, unspecified: Secondary | ICD-10-CM | POA: Diagnosis not present

## 2017-12-06 DIAGNOSIS — T63441D Toxic effect of venom of bees, accidental (unintentional), subsequent encounter: Secondary | ICD-10-CM | POA: Diagnosis not present

## 2017-12-06 DIAGNOSIS — T63461D Toxic effect of venom of wasps, accidental (unintentional), subsequent encounter: Secondary | ICD-10-CM | POA: Diagnosis not present

## 2017-12-06 DIAGNOSIS — T63451D Toxic effect of venom of hornets, accidental (unintentional), subsequent encounter: Secondary | ICD-10-CM | POA: Diagnosis not present

## 2017-12-13 DIAGNOSIS — T63461D Toxic effect of venom of wasps, accidental (unintentional), subsequent encounter: Secondary | ICD-10-CM | POA: Diagnosis not present

## 2017-12-13 DIAGNOSIS — T63451D Toxic effect of venom of hornets, accidental (unintentional), subsequent encounter: Secondary | ICD-10-CM | POA: Diagnosis not present

## 2017-12-13 DIAGNOSIS — T63441D Toxic effect of venom of bees, accidental (unintentional), subsequent encounter: Secondary | ICD-10-CM | POA: Diagnosis not present

## 2017-12-20 DIAGNOSIS — T63441D Toxic effect of venom of bees, accidental (unintentional), subsequent encounter: Secondary | ICD-10-CM | POA: Diagnosis not present

## 2017-12-20 DIAGNOSIS — T63451D Toxic effect of venom of hornets, accidental (unintentional), subsequent encounter: Secondary | ICD-10-CM | POA: Diagnosis not present

## 2017-12-20 DIAGNOSIS — T63461D Toxic effect of venom of wasps, accidental (unintentional), subsequent encounter: Secondary | ICD-10-CM | POA: Diagnosis not present

## 2017-12-27 DIAGNOSIS — T63461D Toxic effect of venom of wasps, accidental (unintentional), subsequent encounter: Secondary | ICD-10-CM | POA: Diagnosis not present

## 2017-12-27 DIAGNOSIS — T63441D Toxic effect of venom of bees, accidental (unintentional), subsequent encounter: Secondary | ICD-10-CM | POA: Diagnosis not present

## 2017-12-27 DIAGNOSIS — T63451D Toxic effect of venom of hornets, accidental (unintentional), subsequent encounter: Secondary | ICD-10-CM | POA: Diagnosis not present

## 2018-01-03 DIAGNOSIS — T63441D Toxic effect of venom of bees, accidental (unintentional), subsequent encounter: Secondary | ICD-10-CM | POA: Diagnosis not present

## 2018-01-03 DIAGNOSIS — T63461D Toxic effect of venom of wasps, accidental (unintentional), subsequent encounter: Secondary | ICD-10-CM | POA: Diagnosis not present

## 2018-01-03 DIAGNOSIS — T63451D Toxic effect of venom of hornets, accidental (unintentional), subsequent encounter: Secondary | ICD-10-CM | POA: Diagnosis not present

## 2018-01-10 DIAGNOSIS — T63461D Toxic effect of venom of wasps, accidental (unintentional), subsequent encounter: Secondary | ICD-10-CM | POA: Diagnosis not present

## 2018-01-10 DIAGNOSIS — T63441D Toxic effect of venom of bees, accidental (unintentional), subsequent encounter: Secondary | ICD-10-CM | POA: Diagnosis not present

## 2018-01-10 DIAGNOSIS — T63451D Toxic effect of venom of hornets, accidental (unintentional), subsequent encounter: Secondary | ICD-10-CM | POA: Diagnosis not present

## 2018-01-17 DIAGNOSIS — T63441D Toxic effect of venom of bees, accidental (unintentional), subsequent encounter: Secondary | ICD-10-CM | POA: Diagnosis not present

## 2018-01-17 DIAGNOSIS — T63451D Toxic effect of venom of hornets, accidental (unintentional), subsequent encounter: Secondary | ICD-10-CM | POA: Diagnosis not present

## 2018-01-17 DIAGNOSIS — T63461D Toxic effect of venom of wasps, accidental (unintentional), subsequent encounter: Secondary | ICD-10-CM | POA: Diagnosis not present

## 2018-01-24 DIAGNOSIS — T63451D Toxic effect of venom of hornets, accidental (unintentional), subsequent encounter: Secondary | ICD-10-CM | POA: Diagnosis not present

## 2018-01-24 DIAGNOSIS — T63441D Toxic effect of venom of bees, accidental (unintentional), subsequent encounter: Secondary | ICD-10-CM | POA: Diagnosis not present

## 2018-01-24 DIAGNOSIS — T63461D Toxic effect of venom of wasps, accidental (unintentional), subsequent encounter: Secondary | ICD-10-CM | POA: Diagnosis not present

## 2018-01-31 DIAGNOSIS — T63461D Toxic effect of venom of wasps, accidental (unintentional), subsequent encounter: Secondary | ICD-10-CM | POA: Diagnosis not present

## 2018-01-31 DIAGNOSIS — T63441D Toxic effect of venom of bees, accidental (unintentional), subsequent encounter: Secondary | ICD-10-CM | POA: Diagnosis not present

## 2018-01-31 DIAGNOSIS — T63451D Toxic effect of venom of hornets, accidental (unintentional), subsequent encounter: Secondary | ICD-10-CM | POA: Diagnosis not present

## 2018-02-07 DIAGNOSIS — T63441D Toxic effect of venom of bees, accidental (unintentional), subsequent encounter: Secondary | ICD-10-CM | POA: Diagnosis not present

## 2018-02-07 DIAGNOSIS — T63451D Toxic effect of venom of hornets, accidental (unintentional), subsequent encounter: Secondary | ICD-10-CM | POA: Diagnosis not present

## 2018-02-07 DIAGNOSIS — T63461D Toxic effect of venom of wasps, accidental (unintentional), subsequent encounter: Secondary | ICD-10-CM | POA: Diagnosis not present

## 2018-02-21 DIAGNOSIS — T63451D Toxic effect of venom of hornets, accidental (unintentional), subsequent encounter: Secondary | ICD-10-CM | POA: Diagnosis not present

## 2018-02-21 DIAGNOSIS — T63461D Toxic effect of venom of wasps, accidental (unintentional), subsequent encounter: Secondary | ICD-10-CM | POA: Diagnosis not present

## 2018-02-21 DIAGNOSIS — T63441D Toxic effect of venom of bees, accidental (unintentional), subsequent encounter: Secondary | ICD-10-CM | POA: Diagnosis not present

## 2018-03-07 DIAGNOSIS — T63441D Toxic effect of venom of bees, accidental (unintentional), subsequent encounter: Secondary | ICD-10-CM | POA: Diagnosis not present

## 2018-03-07 DIAGNOSIS — T63451D Toxic effect of venom of hornets, accidental (unintentional), subsequent encounter: Secondary | ICD-10-CM | POA: Diagnosis not present

## 2018-03-07 DIAGNOSIS — T63461D Toxic effect of venom of wasps, accidental (unintentional), subsequent encounter: Secondary | ICD-10-CM | POA: Diagnosis not present

## 2018-03-28 DIAGNOSIS — T63461D Toxic effect of venom of wasps, accidental (unintentional), subsequent encounter: Secondary | ICD-10-CM | POA: Diagnosis not present

## 2018-03-28 DIAGNOSIS — T63451D Toxic effect of venom of hornets, accidental (unintentional), subsequent encounter: Secondary | ICD-10-CM | POA: Diagnosis not present

## 2018-03-28 DIAGNOSIS — T63441D Toxic effect of venom of bees, accidental (unintentional), subsequent encounter: Secondary | ICD-10-CM | POA: Diagnosis not present

## 2018-04-25 DIAGNOSIS — T63451D Toxic effect of venom of hornets, accidental (unintentional), subsequent encounter: Secondary | ICD-10-CM | POA: Diagnosis not present

## 2018-04-25 DIAGNOSIS — T63441D Toxic effect of venom of bees, accidental (unintentional), subsequent encounter: Secondary | ICD-10-CM | POA: Diagnosis not present

## 2018-04-25 DIAGNOSIS — T63461D Toxic effect of venom of wasps, accidental (unintentional), subsequent encounter: Secondary | ICD-10-CM | POA: Diagnosis not present

## 2018-05-06 DIAGNOSIS — Z01419 Encounter for gynecological examination (general) (routine) without abnormal findings: Secondary | ICD-10-CM | POA: Diagnosis not present

## 2018-05-06 DIAGNOSIS — Z681 Body mass index (BMI) 19 or less, adult: Secondary | ICD-10-CM | POA: Diagnosis not present

## 2018-05-06 DIAGNOSIS — Z1231 Encounter for screening mammogram for malignant neoplasm of breast: Secondary | ICD-10-CM | POA: Diagnosis not present

## 2018-05-23 DIAGNOSIS — T63451D Toxic effect of venom of hornets, accidental (unintentional), subsequent encounter: Secondary | ICD-10-CM | POA: Diagnosis not present

## 2018-05-23 DIAGNOSIS — T63441D Toxic effect of venom of bees, accidental (unintentional), subsequent encounter: Secondary | ICD-10-CM | POA: Diagnosis not present

## 2018-05-23 DIAGNOSIS — T63461D Toxic effect of venom of wasps, accidental (unintentional), subsequent encounter: Secondary | ICD-10-CM | POA: Diagnosis not present

## 2018-06-03 ENCOUNTER — Other Ambulatory Visit: Payer: Self-pay

## 2018-06-03 DIAGNOSIS — D225 Melanocytic nevi of trunk: Secondary | ICD-10-CM | POA: Diagnosis not present

## 2018-06-03 DIAGNOSIS — D235 Other benign neoplasm of skin of trunk: Secondary | ICD-10-CM | POA: Diagnosis not present

## 2018-06-03 DIAGNOSIS — D234 Other benign neoplasm of skin of scalp and neck: Secondary | ICD-10-CM | POA: Diagnosis not present

## 2018-06-26 DIAGNOSIS — T63461D Toxic effect of venom of wasps, accidental (unintentional), subsequent encounter: Secondary | ICD-10-CM | POA: Diagnosis not present

## 2018-06-26 DIAGNOSIS — T63441D Toxic effect of venom of bees, accidental (unintentional), subsequent encounter: Secondary | ICD-10-CM | POA: Diagnosis not present

## 2018-06-26 DIAGNOSIS — T63451D Toxic effect of venom of hornets, accidental (unintentional), subsequent encounter: Secondary | ICD-10-CM | POA: Diagnosis not present

## 2018-07-25 DIAGNOSIS — T63441D Toxic effect of venom of bees, accidental (unintentional), subsequent encounter: Secondary | ICD-10-CM | POA: Diagnosis not present

## 2018-07-25 DIAGNOSIS — T63451D Toxic effect of venom of hornets, accidental (unintentional), subsequent encounter: Secondary | ICD-10-CM | POA: Diagnosis not present

## 2018-07-25 DIAGNOSIS — T63461D Toxic effect of venom of wasps, accidental (unintentional), subsequent encounter: Secondary | ICD-10-CM | POA: Diagnosis not present

## 2018-08-15 ENCOUNTER — Other Ambulatory Visit: Payer: Self-pay

## 2018-08-15 DIAGNOSIS — D235 Other benign neoplasm of skin of trunk: Secondary | ICD-10-CM | POA: Diagnosis not present

## 2018-08-21 DIAGNOSIS — T63461D Toxic effect of venom of wasps, accidental (unintentional), subsequent encounter: Secondary | ICD-10-CM | POA: Diagnosis not present

## 2018-08-21 DIAGNOSIS — T63441D Toxic effect of venom of bees, accidental (unintentional), subsequent encounter: Secondary | ICD-10-CM | POA: Diagnosis not present

## 2018-08-21 DIAGNOSIS — T63451D Toxic effect of venom of hornets, accidental (unintentional), subsequent encounter: Secondary | ICD-10-CM | POA: Diagnosis not present

## 2018-09-20 DIAGNOSIS — T63441D Toxic effect of venom of bees, accidental (unintentional), subsequent encounter: Secondary | ICD-10-CM | POA: Diagnosis not present

## 2018-09-20 DIAGNOSIS — J301 Allergic rhinitis due to pollen: Secondary | ICD-10-CM | POA: Diagnosis not present

## 2018-09-20 DIAGNOSIS — T63451D Toxic effect of venom of hornets, accidental (unintentional), subsequent encounter: Secondary | ICD-10-CM | POA: Diagnosis not present

## 2018-09-20 DIAGNOSIS — T63461D Toxic effect of venom of wasps, accidental (unintentional), subsequent encounter: Secondary | ICD-10-CM | POA: Diagnosis not present

## 2018-09-20 DIAGNOSIS — J3089 Other allergic rhinitis: Secondary | ICD-10-CM | POA: Diagnosis not present

## 2018-09-20 DIAGNOSIS — J3081 Allergic rhinitis due to animal (cat) (dog) hair and dander: Secondary | ICD-10-CM | POA: Diagnosis not present

## 2018-09-20 DIAGNOSIS — Z91030 Bee allergy status: Secondary | ICD-10-CM | POA: Diagnosis not present

## 2018-10-24 DIAGNOSIS — T63461D Toxic effect of venom of wasps, accidental (unintentional), subsequent encounter: Secondary | ICD-10-CM | POA: Diagnosis not present

## 2018-10-24 DIAGNOSIS — T63451D Toxic effect of venom of hornets, accidental (unintentional), subsequent encounter: Secondary | ICD-10-CM | POA: Diagnosis not present

## 2018-10-24 DIAGNOSIS — T63441D Toxic effect of venom of bees, accidental (unintentional), subsequent encounter: Secondary | ICD-10-CM | POA: Diagnosis not present

## 2018-11-27 DIAGNOSIS — T63461D Toxic effect of venom of wasps, accidental (unintentional), subsequent encounter: Secondary | ICD-10-CM | POA: Diagnosis not present

## 2018-11-27 DIAGNOSIS — T63441D Toxic effect of venom of bees, accidental (unintentional), subsequent encounter: Secondary | ICD-10-CM | POA: Diagnosis not present

## 2018-11-27 DIAGNOSIS — T63451D Toxic effect of venom of hornets, accidental (unintentional), subsequent encounter: Secondary | ICD-10-CM | POA: Diagnosis not present

## 2018-12-02 DIAGNOSIS — Z0000 Encounter for general adult medical examination without abnormal findings: Secondary | ICD-10-CM | POA: Diagnosis not present

## 2018-12-02 DIAGNOSIS — E785 Hyperlipidemia, unspecified: Secondary | ICD-10-CM | POA: Diagnosis not present

## 2018-12-02 DIAGNOSIS — Z5181 Encounter for therapeutic drug level monitoring: Secondary | ICD-10-CM | POA: Diagnosis not present

## 2018-12-26 DIAGNOSIS — T63451D Toxic effect of venom of hornets, accidental (unintentional), subsequent encounter: Secondary | ICD-10-CM | POA: Diagnosis not present

## 2018-12-26 DIAGNOSIS — T63461D Toxic effect of venom of wasps, accidental (unintentional), subsequent encounter: Secondary | ICD-10-CM | POA: Diagnosis not present

## 2018-12-26 DIAGNOSIS — T63441D Toxic effect of venom of bees, accidental (unintentional), subsequent encounter: Secondary | ICD-10-CM | POA: Diagnosis not present

## 2019-01-22 DIAGNOSIS — T63441D Toxic effect of venom of bees, accidental (unintentional), subsequent encounter: Secondary | ICD-10-CM | POA: Diagnosis not present

## 2019-01-22 DIAGNOSIS — T63451D Toxic effect of venom of hornets, accidental (unintentional), subsequent encounter: Secondary | ICD-10-CM | POA: Diagnosis not present

## 2019-01-22 DIAGNOSIS — T63461D Toxic effect of venom of wasps, accidental (unintentional), subsequent encounter: Secondary | ICD-10-CM | POA: Diagnosis not present

## 2019-02-27 DIAGNOSIS — T63441D Toxic effect of venom of bees, accidental (unintentional), subsequent encounter: Secondary | ICD-10-CM | POA: Diagnosis not present

## 2019-02-27 DIAGNOSIS — T63451D Toxic effect of venom of hornets, accidental (unintentional), subsequent encounter: Secondary | ICD-10-CM | POA: Diagnosis not present

## 2019-02-27 DIAGNOSIS — T63461D Toxic effect of venom of wasps, accidental (unintentional), subsequent encounter: Secondary | ICD-10-CM | POA: Diagnosis not present

## 2019-03-27 DIAGNOSIS — T63461D Toxic effect of venom of wasps, accidental (unintentional), subsequent encounter: Secondary | ICD-10-CM | POA: Diagnosis not present

## 2019-03-27 DIAGNOSIS — T63441D Toxic effect of venom of bees, accidental (unintentional), subsequent encounter: Secondary | ICD-10-CM | POA: Diagnosis not present

## 2019-03-27 DIAGNOSIS — T63451D Toxic effect of venom of hornets, accidental (unintentional), subsequent encounter: Secondary | ICD-10-CM | POA: Diagnosis not present

## 2019-04-24 DIAGNOSIS — T63441D Toxic effect of venom of bees, accidental (unintentional), subsequent encounter: Secondary | ICD-10-CM | POA: Diagnosis not present

## 2019-04-24 DIAGNOSIS — T63451D Toxic effect of venom of hornets, accidental (unintentional), subsequent encounter: Secondary | ICD-10-CM | POA: Diagnosis not present

## 2019-04-24 DIAGNOSIS — T63461D Toxic effect of venom of wasps, accidental (unintentional), subsequent encounter: Secondary | ICD-10-CM | POA: Diagnosis not present

## 2019-05-28 DIAGNOSIS — T63461D Toxic effect of venom of wasps, accidental (unintentional), subsequent encounter: Secondary | ICD-10-CM | POA: Diagnosis not present

## 2019-05-28 DIAGNOSIS — T63441D Toxic effect of venom of bees, accidental (unintentional), subsequent encounter: Secondary | ICD-10-CM | POA: Diagnosis not present

## 2019-05-28 DIAGNOSIS — T63451D Toxic effect of venom of hornets, accidental (unintentional), subsequent encounter: Secondary | ICD-10-CM | POA: Diagnosis not present

## 2019-06-16 DIAGNOSIS — Z681 Body mass index (BMI) 19 or less, adult: Secondary | ICD-10-CM | POA: Diagnosis not present

## 2019-06-16 DIAGNOSIS — Z01419 Encounter for gynecological examination (general) (routine) without abnormal findings: Secondary | ICD-10-CM | POA: Diagnosis not present

## 2019-06-16 DIAGNOSIS — Z1231 Encounter for screening mammogram for malignant neoplasm of breast: Secondary | ICD-10-CM | POA: Diagnosis not present

## 2019-06-26 DIAGNOSIS — T63441D Toxic effect of venom of bees, accidental (unintentional), subsequent encounter: Secondary | ICD-10-CM | POA: Diagnosis not present

## 2019-06-26 DIAGNOSIS — T63451D Toxic effect of venom of hornets, accidental (unintentional), subsequent encounter: Secondary | ICD-10-CM | POA: Diagnosis not present

## 2019-06-26 DIAGNOSIS — T63461D Toxic effect of venom of wasps, accidental (unintentional), subsequent encounter: Secondary | ICD-10-CM | POA: Diagnosis not present

## 2019-07-15 DIAGNOSIS — Z1159 Encounter for screening for other viral diseases: Secondary | ICD-10-CM | POA: Diagnosis not present

## 2019-07-18 DIAGNOSIS — K621 Rectal polyp: Secondary | ICD-10-CM | POA: Diagnosis not present

## 2019-07-18 DIAGNOSIS — Z1211 Encounter for screening for malignant neoplasm of colon: Secondary | ICD-10-CM | POA: Diagnosis not present

## 2019-07-25 DIAGNOSIS — T63461D Toxic effect of venom of wasps, accidental (unintentional), subsequent encounter: Secondary | ICD-10-CM | POA: Diagnosis not present

## 2019-07-25 DIAGNOSIS — T63451D Toxic effect of venom of hornets, accidental (unintentional), subsequent encounter: Secondary | ICD-10-CM | POA: Diagnosis not present

## 2019-07-25 DIAGNOSIS — Z23 Encounter for immunization: Secondary | ICD-10-CM | POA: Diagnosis not present

## 2019-07-25 DIAGNOSIS — T63441D Toxic effect of venom of bees, accidental (unintentional), subsequent encounter: Secondary | ICD-10-CM | POA: Diagnosis not present

## 2019-08-18 DIAGNOSIS — R309 Painful micturition, unspecified: Secondary | ICD-10-CM | POA: Diagnosis not present

## 2019-08-18 DIAGNOSIS — Z780 Asymptomatic menopausal state: Secondary | ICD-10-CM | POA: Diagnosis not present

## 2019-08-20 DIAGNOSIS — R278 Other lack of coordination: Secondary | ICD-10-CM | POA: Diagnosis not present

## 2019-08-20 DIAGNOSIS — K6289 Other specified diseases of anus and rectum: Secondary | ICD-10-CM | POA: Diagnosis not present

## 2019-08-27 DIAGNOSIS — T63441D Toxic effect of venom of bees, accidental (unintentional), subsequent encounter: Secondary | ICD-10-CM | POA: Diagnosis not present

## 2019-08-27 DIAGNOSIS — T63451D Toxic effect of venom of hornets, accidental (unintentional), subsequent encounter: Secondary | ICD-10-CM | POA: Diagnosis not present

## 2019-08-27 DIAGNOSIS — T63461D Toxic effect of venom of wasps, accidental (unintentional), subsequent encounter: Secondary | ICD-10-CM | POA: Diagnosis not present

## 2019-09-24 DIAGNOSIS — T63451D Toxic effect of venom of hornets, accidental (unintentional), subsequent encounter: Secondary | ICD-10-CM | POA: Diagnosis not present

## 2019-09-24 DIAGNOSIS — J301 Allergic rhinitis due to pollen: Secondary | ICD-10-CM | POA: Diagnosis not present

## 2019-09-24 DIAGNOSIS — J3089 Other allergic rhinitis: Secondary | ICD-10-CM | POA: Diagnosis not present

## 2019-09-24 DIAGNOSIS — H1045 Other chronic allergic conjunctivitis: Secondary | ICD-10-CM | POA: Diagnosis not present

## 2019-09-24 DIAGNOSIS — T63461D Toxic effect of venom of wasps, accidental (unintentional), subsequent encounter: Secondary | ICD-10-CM | POA: Diagnosis not present

## 2019-09-24 DIAGNOSIS — T63441D Toxic effect of venom of bees, accidental (unintentional), subsequent encounter: Secondary | ICD-10-CM | POA: Diagnosis not present

## 2019-09-24 DIAGNOSIS — J3081 Allergic rhinitis due to animal (cat) (dog) hair and dander: Secondary | ICD-10-CM | POA: Diagnosis not present

## 2019-11-03 DIAGNOSIS — T63451D Toxic effect of venom of hornets, accidental (unintentional), subsequent encounter: Secondary | ICD-10-CM | POA: Diagnosis not present

## 2019-11-03 DIAGNOSIS — T63461D Toxic effect of venom of wasps, accidental (unintentional), subsequent encounter: Secondary | ICD-10-CM | POA: Diagnosis not present

## 2019-11-03 DIAGNOSIS — T63441D Toxic effect of venom of bees, accidental (unintentional), subsequent encounter: Secondary | ICD-10-CM | POA: Diagnosis not present

## 2019-11-10 DIAGNOSIS — T63441D Toxic effect of venom of bees, accidental (unintentional), subsequent encounter: Secondary | ICD-10-CM | POA: Diagnosis not present

## 2019-11-10 DIAGNOSIS — T63461D Toxic effect of venom of wasps, accidental (unintentional), subsequent encounter: Secondary | ICD-10-CM | POA: Diagnosis not present

## 2019-11-10 DIAGNOSIS — T63451D Toxic effect of venom of hornets, accidental (unintentional), subsequent encounter: Secondary | ICD-10-CM | POA: Diagnosis not present

## 2019-12-03 DIAGNOSIS — E785 Hyperlipidemia, unspecified: Secondary | ICD-10-CM | POA: Diagnosis not present

## 2019-12-03 DIAGNOSIS — Z5181 Encounter for therapeutic drug level monitoring: Secondary | ICD-10-CM | POA: Diagnosis not present

## 2019-12-03 DIAGNOSIS — Z0000 Encounter for general adult medical examination without abnormal findings: Secondary | ICD-10-CM | POA: Diagnosis not present

## 2019-12-10 DIAGNOSIS — T63451D Toxic effect of venom of hornets, accidental (unintentional), subsequent encounter: Secondary | ICD-10-CM | POA: Diagnosis not present

## 2019-12-10 DIAGNOSIS — T63441D Toxic effect of venom of bees, accidental (unintentional), subsequent encounter: Secondary | ICD-10-CM | POA: Diagnosis not present

## 2019-12-10 DIAGNOSIS — T63461D Toxic effect of venom of wasps, accidental (unintentional), subsequent encounter: Secondary | ICD-10-CM | POA: Diagnosis not present

## 2020-01-07 DIAGNOSIS — T63441D Toxic effect of venom of bees, accidental (unintentional), subsequent encounter: Secondary | ICD-10-CM | POA: Diagnosis not present

## 2020-01-07 DIAGNOSIS — T63461D Toxic effect of venom of wasps, accidental (unintentional), subsequent encounter: Secondary | ICD-10-CM | POA: Diagnosis not present

## 2020-01-07 DIAGNOSIS — T63451D Toxic effect of venom of hornets, accidental (unintentional), subsequent encounter: Secondary | ICD-10-CM | POA: Diagnosis not present

## 2020-02-04 DIAGNOSIS — T63441D Toxic effect of venom of bees, accidental (unintentional), subsequent encounter: Secondary | ICD-10-CM | POA: Diagnosis not present

## 2020-02-04 DIAGNOSIS — T63461D Toxic effect of venom of wasps, accidental (unintentional), subsequent encounter: Secondary | ICD-10-CM | POA: Diagnosis not present

## 2020-02-04 DIAGNOSIS — T63451D Toxic effect of venom of hornets, accidental (unintentional), subsequent encounter: Secondary | ICD-10-CM | POA: Diagnosis not present

## 2020-03-03 DIAGNOSIS — T63451D Toxic effect of venom of hornets, accidental (unintentional), subsequent encounter: Secondary | ICD-10-CM | POA: Diagnosis not present

## 2020-03-03 DIAGNOSIS — T63461D Toxic effect of venom of wasps, accidental (unintentional), subsequent encounter: Secondary | ICD-10-CM | POA: Diagnosis not present

## 2020-03-03 DIAGNOSIS — T63441D Toxic effect of venom of bees, accidental (unintentional), subsequent encounter: Secondary | ICD-10-CM | POA: Diagnosis not present

## 2020-04-01 DIAGNOSIS — T63451D Toxic effect of venom of hornets, accidental (unintentional), subsequent encounter: Secondary | ICD-10-CM | POA: Diagnosis not present

## 2020-04-01 DIAGNOSIS — T63461D Toxic effect of venom of wasps, accidental (unintentional), subsequent encounter: Secondary | ICD-10-CM | POA: Diagnosis not present

## 2020-04-01 DIAGNOSIS — T63441D Toxic effect of venom of bees, accidental (unintentional), subsequent encounter: Secondary | ICD-10-CM | POA: Diagnosis not present

## 2020-04-20 DIAGNOSIS — Z1159 Encounter for screening for other viral diseases: Secondary | ICD-10-CM | POA: Diagnosis not present

## 2020-04-23 DIAGNOSIS — K635 Polyp of colon: Secondary | ICD-10-CM | POA: Diagnosis not present

## 2020-04-23 DIAGNOSIS — K6289 Other specified diseases of anus and rectum: Secondary | ICD-10-CM | POA: Diagnosis not present

## 2020-04-23 DIAGNOSIS — Z86010 Personal history of colonic polyps: Secondary | ICD-10-CM | POA: Diagnosis not present

## 2020-05-04 DIAGNOSIS — T63451D Toxic effect of venom of hornets, accidental (unintentional), subsequent encounter: Secondary | ICD-10-CM | POA: Diagnosis not present

## 2020-05-04 DIAGNOSIS — T63441D Toxic effect of venom of bees, accidental (unintentional), subsequent encounter: Secondary | ICD-10-CM | POA: Diagnosis not present

## 2020-05-04 DIAGNOSIS — T63461D Toxic effect of venom of wasps, accidental (unintentional), subsequent encounter: Secondary | ICD-10-CM | POA: Diagnosis not present

## 2020-06-03 DIAGNOSIS — T63451D Toxic effect of venom of hornets, accidental (unintentional), subsequent encounter: Secondary | ICD-10-CM | POA: Diagnosis not present

## 2020-06-03 DIAGNOSIS — T63461D Toxic effect of venom of wasps, accidental (unintentional), subsequent encounter: Secondary | ICD-10-CM | POA: Diagnosis not present

## 2020-06-03 DIAGNOSIS — T63441D Toxic effect of venom of bees, accidental (unintentional), subsequent encounter: Secondary | ICD-10-CM | POA: Diagnosis not present

## 2020-07-01 DIAGNOSIS — T63451D Toxic effect of venom of hornets, accidental (unintentional), subsequent encounter: Secondary | ICD-10-CM | POA: Diagnosis not present

## 2020-07-01 DIAGNOSIS — T63441D Toxic effect of venom of bees, accidental (unintentional), subsequent encounter: Secondary | ICD-10-CM | POA: Diagnosis not present

## 2020-07-01 DIAGNOSIS — T63461D Toxic effect of venom of wasps, accidental (unintentional), subsequent encounter: Secondary | ICD-10-CM | POA: Diagnosis not present

## 2020-07-23 DIAGNOSIS — Z01419 Encounter for gynecological examination (general) (routine) without abnormal findings: Secondary | ICD-10-CM | POA: Diagnosis not present

## 2020-07-23 DIAGNOSIS — Z1231 Encounter for screening mammogram for malignant neoplasm of breast: Secondary | ICD-10-CM | POA: Diagnosis not present

## 2020-08-04 DIAGNOSIS — T63441D Toxic effect of venom of bees, accidental (unintentional), subsequent encounter: Secondary | ICD-10-CM | POA: Diagnosis not present

## 2020-08-04 DIAGNOSIS — T63451D Toxic effect of venom of hornets, accidental (unintentional), subsequent encounter: Secondary | ICD-10-CM | POA: Diagnosis not present

## 2020-08-04 DIAGNOSIS — T63461D Toxic effect of venom of wasps, accidental (unintentional), subsequent encounter: Secondary | ICD-10-CM | POA: Diagnosis not present

## 2020-09-02 DIAGNOSIS — T63451D Toxic effect of venom of hornets, accidental (unintentional), subsequent encounter: Secondary | ICD-10-CM | POA: Diagnosis not present

## 2020-09-02 DIAGNOSIS — T63461D Toxic effect of venom of wasps, accidental (unintentional), subsequent encounter: Secondary | ICD-10-CM | POA: Diagnosis not present

## 2020-09-02 DIAGNOSIS — T63441D Toxic effect of venom of bees, accidental (unintentional), subsequent encounter: Secondary | ICD-10-CM | POA: Diagnosis not present

## 2020-09-29 DIAGNOSIS — T63451D Toxic effect of venom of hornets, accidental (unintentional), subsequent encounter: Secondary | ICD-10-CM | POA: Diagnosis not present

## 2020-09-29 DIAGNOSIS — T63461D Toxic effect of venom of wasps, accidental (unintentional), subsequent encounter: Secondary | ICD-10-CM | POA: Diagnosis not present

## 2020-09-29 DIAGNOSIS — T63441D Toxic effect of venom of bees, accidental (unintentional), subsequent encounter: Secondary | ICD-10-CM | POA: Diagnosis not present

## 2020-11-03 DIAGNOSIS — T63461D Toxic effect of venom of wasps, accidental (unintentional), subsequent encounter: Secondary | ICD-10-CM | POA: Diagnosis not present

## 2020-11-03 DIAGNOSIS — T63451D Toxic effect of venom of hornets, accidental (unintentional), subsequent encounter: Secondary | ICD-10-CM | POA: Diagnosis not present

## 2020-11-03 DIAGNOSIS — T63441D Toxic effect of venom of bees, accidental (unintentional), subsequent encounter: Secondary | ICD-10-CM | POA: Diagnosis not present

## 2020-11-19 DIAGNOSIS — J3081 Allergic rhinitis due to animal (cat) (dog) hair and dander: Secondary | ICD-10-CM | POA: Diagnosis not present

## 2020-11-19 DIAGNOSIS — H1045 Other chronic allergic conjunctivitis: Secondary | ICD-10-CM | POA: Diagnosis not present

## 2020-11-19 DIAGNOSIS — J3089 Other allergic rhinitis: Secondary | ICD-10-CM | POA: Diagnosis not present

## 2020-11-19 DIAGNOSIS — J301 Allergic rhinitis due to pollen: Secondary | ICD-10-CM | POA: Diagnosis not present

## 2020-12-02 DIAGNOSIS — T63441D Toxic effect of venom of bees, accidental (unintentional), subsequent encounter: Secondary | ICD-10-CM | POA: Diagnosis not present

## 2020-12-02 DIAGNOSIS — T63461D Toxic effect of venom of wasps, accidental (unintentional), subsequent encounter: Secondary | ICD-10-CM | POA: Diagnosis not present

## 2020-12-02 DIAGNOSIS — T63451D Toxic effect of venom of hornets, accidental (unintentional), subsequent encounter: Secondary | ICD-10-CM | POA: Diagnosis not present

## 2020-12-09 DIAGNOSIS — Z5181 Encounter for therapeutic drug level monitoring: Secondary | ICD-10-CM | POA: Diagnosis not present

## 2020-12-09 DIAGNOSIS — E785 Hyperlipidemia, unspecified: Secondary | ICD-10-CM | POA: Diagnosis not present

## 2020-12-09 DIAGNOSIS — R791 Abnormal coagulation profile: Secondary | ICD-10-CM | POA: Diagnosis not present

## 2020-12-09 DIAGNOSIS — Z0000 Encounter for general adult medical examination without abnormal findings: Secondary | ICD-10-CM | POA: Diagnosis not present

## 2020-12-29 DIAGNOSIS — T63461D Toxic effect of venom of wasps, accidental (unintentional), subsequent encounter: Secondary | ICD-10-CM | POA: Diagnosis not present

## 2020-12-29 DIAGNOSIS — T63451D Toxic effect of venom of hornets, accidental (unintentional), subsequent encounter: Secondary | ICD-10-CM | POA: Diagnosis not present

## 2020-12-29 DIAGNOSIS — T63441D Toxic effect of venom of bees, accidental (unintentional), subsequent encounter: Secondary | ICD-10-CM | POA: Diagnosis not present

## 2021-02-01 DIAGNOSIS — T63441D Toxic effect of venom of bees, accidental (unintentional), subsequent encounter: Secondary | ICD-10-CM | POA: Diagnosis not present

## 2021-02-01 DIAGNOSIS — T63461D Toxic effect of venom of wasps, accidental (unintentional), subsequent encounter: Secondary | ICD-10-CM | POA: Diagnosis not present

## 2021-02-01 DIAGNOSIS — T63451D Toxic effect of venom of hornets, accidental (unintentional), subsequent encounter: Secondary | ICD-10-CM | POA: Diagnosis not present

## 2021-03-03 DIAGNOSIS — T63451D Toxic effect of venom of hornets, accidental (unintentional), subsequent encounter: Secondary | ICD-10-CM | POA: Diagnosis not present

## 2021-03-03 DIAGNOSIS — T63441D Toxic effect of venom of bees, accidental (unintentional), subsequent encounter: Secondary | ICD-10-CM | POA: Diagnosis not present

## 2021-03-03 DIAGNOSIS — T63461D Toxic effect of venom of wasps, accidental (unintentional), subsequent encounter: Secondary | ICD-10-CM | POA: Diagnosis not present

## 2021-03-30 DIAGNOSIS — T63441D Toxic effect of venom of bees, accidental (unintentional), subsequent encounter: Secondary | ICD-10-CM | POA: Diagnosis not present

## 2021-03-30 DIAGNOSIS — T63451D Toxic effect of venom of hornets, accidental (unintentional), subsequent encounter: Secondary | ICD-10-CM | POA: Diagnosis not present

## 2021-03-30 DIAGNOSIS — T63461D Toxic effect of venom of wasps, accidental (unintentional), subsequent encounter: Secondary | ICD-10-CM | POA: Diagnosis not present

## 2021-04-14 DIAGNOSIS — Z23 Encounter for immunization: Secondary | ICD-10-CM | POA: Diagnosis not present

## 2021-04-29 DIAGNOSIS — T63461D Toxic effect of venom of wasps, accidental (unintentional), subsequent encounter: Secondary | ICD-10-CM | POA: Diagnosis not present

## 2021-04-29 DIAGNOSIS — T63441D Toxic effect of venom of bees, accidental (unintentional), subsequent encounter: Secondary | ICD-10-CM | POA: Diagnosis not present

## 2021-04-29 DIAGNOSIS — T63451D Toxic effect of venom of hornets, accidental (unintentional), subsequent encounter: Secondary | ICD-10-CM | POA: Diagnosis not present

## 2021-06-01 DIAGNOSIS — T63441D Toxic effect of venom of bees, accidental (unintentional), subsequent encounter: Secondary | ICD-10-CM | POA: Diagnosis not present

## 2021-06-01 DIAGNOSIS — T63451D Toxic effect of venom of hornets, accidental (unintentional), subsequent encounter: Secondary | ICD-10-CM | POA: Diagnosis not present

## 2021-06-01 DIAGNOSIS — T63461D Toxic effect of venom of wasps, accidental (unintentional), subsequent encounter: Secondary | ICD-10-CM | POA: Diagnosis not present

## 2021-06-30 DIAGNOSIS — T63441D Toxic effect of venom of bees, accidental (unintentional), subsequent encounter: Secondary | ICD-10-CM | POA: Diagnosis not present

## 2021-06-30 DIAGNOSIS — T63451D Toxic effect of venom of hornets, accidental (unintentional), subsequent encounter: Secondary | ICD-10-CM | POA: Diagnosis not present

## 2021-06-30 DIAGNOSIS — T63461D Toxic effect of venom of wasps, accidental (unintentional), subsequent encounter: Secondary | ICD-10-CM | POA: Diagnosis not present

## 2021-07-06 ENCOUNTER — Other Ambulatory Visit: Payer: Self-pay

## 2021-07-06 ENCOUNTER — Encounter: Payer: Self-pay | Admitting: Physician Assistant

## 2021-07-06 ENCOUNTER — Ambulatory Visit: Payer: BC Managed Care – PPO | Admitting: Physician Assistant

## 2021-07-06 DIAGNOSIS — Z1283 Encounter for screening for malignant neoplasm of skin: Secondary | ICD-10-CM

## 2021-07-06 DIAGNOSIS — Z86018 Personal history of other benign neoplasm: Secondary | ICD-10-CM

## 2021-07-06 NOTE — Progress Notes (Signed)
   Follow-Up Visit   Subjective  Melinda Wilkinson is a 55 y.o. 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  A full examination was performed including scalp, head, eyes, ears, nose, lips, neck, chest, axillae, abdomen, back, buttocks, bilateral upper extremities, bilateral lower extremities, hands, feet, fingers, toes, fingernails, and toenails. All findings within normal limits unless otherwise noted below.  Head -  to toe No atypical nevi No signs of non-mole skin cancer.  Scars were clear.  Assessment & Plan  Encounter for screening for malignant neoplasm of skin Head -  to toe  Yearly skin examinations    I, Alexandr Oehler, PA-C, have reviewed all documentation's for this visit.  The documentation on 07/06/21 for the exam, diagnosis, procedures and orders are all accurate and complete.

## 2021-08-01 DIAGNOSIS — T63451D Toxic effect of venom of hornets, accidental (unintentional), subsequent encounter: Secondary | ICD-10-CM | POA: Diagnosis not present

## 2021-08-01 DIAGNOSIS — T63461D Toxic effect of venom of wasps, accidental (unintentional), subsequent encounter: Secondary | ICD-10-CM | POA: Diagnosis not present

## 2021-08-01 DIAGNOSIS — T63441D Toxic effect of venom of bees, accidental (unintentional), subsequent encounter: Secondary | ICD-10-CM | POA: Diagnosis not present

## 2021-08-11 DIAGNOSIS — Z23 Encounter for immunization: Secondary | ICD-10-CM | POA: Diagnosis not present

## 2021-08-11 DIAGNOSIS — H66011 Acute suppurative otitis media with spontaneous rupture of ear drum, right ear: Secondary | ICD-10-CM | POA: Diagnosis not present

## 2021-09-01 DIAGNOSIS — T63451D Toxic effect of venom of hornets, accidental (unintentional), subsequent encounter: Secondary | ICD-10-CM | POA: Diagnosis not present

## 2021-09-01 DIAGNOSIS — T63441D Toxic effect of venom of bees, accidental (unintentional), subsequent encounter: Secondary | ICD-10-CM | POA: Diagnosis not present

## 2021-09-01 DIAGNOSIS — T63461D Toxic effect of venom of wasps, accidental (unintentional), subsequent encounter: Secondary | ICD-10-CM | POA: Diagnosis not present

## 2021-09-22 DIAGNOSIS — Z681 Body mass index (BMI) 19 or less, adult: Secondary | ICD-10-CM | POA: Diagnosis not present

## 2021-09-22 DIAGNOSIS — Z1231 Encounter for screening mammogram for malignant neoplasm of breast: Secondary | ICD-10-CM | POA: Diagnosis not present

## 2021-09-22 DIAGNOSIS — Z01419 Encounter for gynecological examination (general) (routine) without abnormal findings: Secondary | ICD-10-CM | POA: Diagnosis not present

## 2021-09-28 DIAGNOSIS — T63441D Toxic effect of venom of bees, accidental (unintentional), subsequent encounter: Secondary | ICD-10-CM | POA: Diagnosis not present

## 2021-09-28 DIAGNOSIS — T63461D Toxic effect of venom of wasps, accidental (unintentional), subsequent encounter: Secondary | ICD-10-CM | POA: Diagnosis not present

## 2021-09-28 DIAGNOSIS — T63451D Toxic effect of venom of hornets, accidental (unintentional), subsequent encounter: Secondary | ICD-10-CM | POA: Diagnosis not present

## 2021-11-01 DIAGNOSIS — T63461D Toxic effect of venom of wasps, accidental (unintentional), subsequent encounter: Secondary | ICD-10-CM | POA: Diagnosis not present

## 2021-11-01 DIAGNOSIS — T63451D Toxic effect of venom of hornets, accidental (unintentional), subsequent encounter: Secondary | ICD-10-CM | POA: Diagnosis not present

## 2021-11-01 DIAGNOSIS — T63441D Toxic effect of venom of bees, accidental (unintentional), subsequent encounter: Secondary | ICD-10-CM | POA: Diagnosis not present

## 2021-11-28 DIAGNOSIS — T63461D Toxic effect of venom of wasps, accidental (unintentional), subsequent encounter: Secondary | ICD-10-CM | POA: Diagnosis not present

## 2021-11-28 DIAGNOSIS — T63441D Toxic effect of venom of bees, accidental (unintentional), subsequent encounter: Secondary | ICD-10-CM | POA: Diagnosis not present

## 2021-11-28 DIAGNOSIS — J3081 Allergic rhinitis due to animal (cat) (dog) hair and dander: Secondary | ICD-10-CM | POA: Diagnosis not present

## 2021-11-28 DIAGNOSIS — J3089 Other allergic rhinitis: Secondary | ICD-10-CM | POA: Diagnosis not present

## 2021-11-28 DIAGNOSIS — J301 Allergic rhinitis due to pollen: Secondary | ICD-10-CM | POA: Diagnosis not present

## 2021-11-28 DIAGNOSIS — H1045 Other chronic allergic conjunctivitis: Secondary | ICD-10-CM | POA: Diagnosis not present

## 2021-11-28 DIAGNOSIS — T63451D Toxic effect of venom of hornets, accidental (unintentional), subsequent encounter: Secondary | ICD-10-CM | POA: Diagnosis not present

## 2021-12-28 DIAGNOSIS — T63461D Toxic effect of venom of wasps, accidental (unintentional), subsequent encounter: Secondary | ICD-10-CM | POA: Diagnosis not present

## 2021-12-28 DIAGNOSIS — T63441D Toxic effect of venom of bees, accidental (unintentional), subsequent encounter: Secondary | ICD-10-CM | POA: Diagnosis not present

## 2022-01-04 DIAGNOSIS — D649 Anemia, unspecified: Secondary | ICD-10-CM | POA: Diagnosis not present

## 2022-01-04 DIAGNOSIS — E785 Hyperlipidemia, unspecified: Secondary | ICD-10-CM | POA: Diagnosis not present

## 2022-01-04 DIAGNOSIS — Z0000 Encounter for general adult medical examination without abnormal findings: Secondary | ICD-10-CM | POA: Diagnosis not present

## 2022-01-04 DIAGNOSIS — H7291 Unspecified perforation of tympanic membrane, right ear: Secondary | ICD-10-CM | POA: Diagnosis not present

## 2022-01-04 DIAGNOSIS — Z23 Encounter for immunization: Secondary | ICD-10-CM | POA: Diagnosis not present

## 2022-01-26 DIAGNOSIS — T63451D Toxic effect of venom of hornets, accidental (unintentional), subsequent encounter: Secondary | ICD-10-CM | POA: Diagnosis not present

## 2022-01-26 DIAGNOSIS — T63461D Toxic effect of venom of wasps, accidental (unintentional), subsequent encounter: Secondary | ICD-10-CM | POA: Diagnosis not present

## 2022-01-26 DIAGNOSIS — T63441D Toxic effect of venom of bees, accidental (unintentional), subsequent encounter: Secondary | ICD-10-CM | POA: Diagnosis not present

## 2022-03-01 DIAGNOSIS — T63461D Toxic effect of venom of wasps, accidental (unintentional), subsequent encounter: Secondary | ICD-10-CM | POA: Diagnosis not present

## 2022-03-01 DIAGNOSIS — T63441D Toxic effect of venom of bees, accidental (unintentional), subsequent encounter: Secondary | ICD-10-CM | POA: Diagnosis not present

## 2022-03-01 DIAGNOSIS — T63451D Toxic effect of venom of hornets, accidental (unintentional), subsequent encounter: Secondary | ICD-10-CM | POA: Diagnosis not present

## 2022-03-03 DIAGNOSIS — H6981 Other specified disorders of Eustachian tube, right ear: Secondary | ICD-10-CM | POA: Diagnosis not present

## 2022-03-03 DIAGNOSIS — H9211 Otorrhea, right ear: Secondary | ICD-10-CM | POA: Diagnosis not present

## 2022-03-03 DIAGNOSIS — H7111 Cholesteatoma of tympanum, right ear: Secondary | ICD-10-CM | POA: Diagnosis not present

## 2022-03-03 DIAGNOSIS — H7291 Unspecified perforation of tympanic membrane, right ear: Secondary | ICD-10-CM | POA: Diagnosis not present

## 2022-03-12 DIAGNOSIS — M542 Cervicalgia: Secondary | ICD-10-CM | POA: Diagnosis not present

## 2022-03-30 DIAGNOSIS — J3081 Allergic rhinitis due to animal (cat) (dog) hair and dander: Secondary | ICD-10-CM | POA: Diagnosis not present

## 2022-03-30 DIAGNOSIS — J301 Allergic rhinitis due to pollen: Secondary | ICD-10-CM | POA: Diagnosis not present

## 2022-03-30 DIAGNOSIS — J3089 Other allergic rhinitis: Secondary | ICD-10-CM | POA: Diagnosis not present

## 2022-03-31 DIAGNOSIS — J029 Acute pharyngitis, unspecified: Secondary | ICD-10-CM | POA: Diagnosis not present

## 2022-04-03 DIAGNOSIS — H73891 Other specified disorders of tympanic membrane, right ear: Secondary | ICD-10-CM | POA: Diagnosis not present

## 2022-04-03 DIAGNOSIS — H6981 Other specified disorders of Eustachian tube, right ear: Secondary | ICD-10-CM | POA: Diagnosis not present

## 2022-04-03 DIAGNOSIS — H7291 Unspecified perforation of tympanic membrane, right ear: Secondary | ICD-10-CM | POA: Diagnosis not present

## 2022-04-25 DIAGNOSIS — S80871A Other superficial bite, right lower leg, initial encounter: Secondary | ICD-10-CM | POA: Diagnosis not present

## 2022-04-28 DIAGNOSIS — T63451D Toxic effect of venom of hornets, accidental (unintentional), subsequent encounter: Secondary | ICD-10-CM | POA: Diagnosis not present

## 2022-04-28 DIAGNOSIS — T63461D Toxic effect of venom of wasps, accidental (unintentional), subsequent encounter: Secondary | ICD-10-CM | POA: Diagnosis not present

## 2022-04-28 DIAGNOSIS — T63441D Toxic effect of venom of bees, accidental (unintentional), subsequent encounter: Secondary | ICD-10-CM | POA: Diagnosis not present

## 2022-06-01 DIAGNOSIS — T63441D Toxic effect of venom of bees, accidental (unintentional), subsequent encounter: Secondary | ICD-10-CM | POA: Diagnosis not present

## 2022-06-01 DIAGNOSIS — T63451D Toxic effect of venom of hornets, accidental (unintentional), subsequent encounter: Secondary | ICD-10-CM | POA: Diagnosis not present

## 2022-06-01 DIAGNOSIS — T63461D Toxic effect of venom of wasps, accidental (unintentional), subsequent encounter: Secondary | ICD-10-CM | POA: Diagnosis not present

## 2022-06-29 DIAGNOSIS — T63421D Toxic effect of venom of ants, accidental (unintentional), subsequent encounter: Secondary | ICD-10-CM | POA: Diagnosis not present

## 2022-06-29 DIAGNOSIS — T63451D Toxic effect of venom of hornets, accidental (unintentional), subsequent encounter: Secondary | ICD-10-CM | POA: Diagnosis not present

## 2022-06-29 DIAGNOSIS — T63441D Toxic effect of venom of bees, accidental (unintentional), subsequent encounter: Secondary | ICD-10-CM | POA: Diagnosis not present

## 2022-07-06 ENCOUNTER — Ambulatory Visit: Payer: BC Managed Care – PPO | Admitting: Physician Assistant

## 2022-07-25 ENCOUNTER — Ambulatory Visit: Payer: BC Managed Care – PPO | Admitting: Physician Assistant

## 2022-08-02 DIAGNOSIS — T63461D Toxic effect of venom of wasps, accidental (unintentional), subsequent encounter: Secondary | ICD-10-CM | POA: Diagnosis not present

## 2022-08-02 DIAGNOSIS — T63451D Toxic effect of venom of hornets, accidental (unintentional), subsequent encounter: Secondary | ICD-10-CM | POA: Diagnosis not present

## 2022-08-02 DIAGNOSIS — T63441D Toxic effect of venom of bees, accidental (unintentional), subsequent encounter: Secondary | ICD-10-CM | POA: Diagnosis not present

## 2022-08-02 DIAGNOSIS — Z23 Encounter for immunization: Secondary | ICD-10-CM | POA: Diagnosis not present

## 2022-08-30 DIAGNOSIS — T63451D Toxic effect of venom of hornets, accidental (unintentional), subsequent encounter: Secondary | ICD-10-CM | POA: Diagnosis not present

## 2022-08-30 DIAGNOSIS — T63441D Toxic effect of venom of bees, accidental (unintentional), subsequent encounter: Secondary | ICD-10-CM | POA: Diagnosis not present

## 2022-08-30 DIAGNOSIS — T63461D Toxic effect of venom of wasps, accidental (unintentional), subsequent encounter: Secondary | ICD-10-CM | POA: Diagnosis not present

## 2022-09-25 DIAGNOSIS — Z681 Body mass index (BMI) 19 or less, adult: Secondary | ICD-10-CM | POA: Diagnosis not present

## 2022-09-25 DIAGNOSIS — Z1231 Encounter for screening mammogram for malignant neoplasm of breast: Secondary | ICD-10-CM | POA: Diagnosis not present

## 2022-09-25 DIAGNOSIS — Z124 Encounter for screening for malignant neoplasm of cervix: Secondary | ICD-10-CM | POA: Diagnosis not present

## 2022-09-25 DIAGNOSIS — Z1151 Encounter for screening for human papillomavirus (HPV): Secondary | ICD-10-CM | POA: Diagnosis not present

## 2022-09-25 DIAGNOSIS — Z01419 Encounter for gynecological examination (general) (routine) without abnormal findings: Secondary | ICD-10-CM | POA: Diagnosis not present

## 2022-09-27 DIAGNOSIS — T63441D Toxic effect of venom of bees, accidental (unintentional), subsequent encounter: Secondary | ICD-10-CM | POA: Diagnosis not present

## 2022-09-27 DIAGNOSIS — T63461D Toxic effect of venom of wasps, accidental (unintentional), subsequent encounter: Secondary | ICD-10-CM | POA: Diagnosis not present

## 2022-09-27 DIAGNOSIS — T63451D Toxic effect of venom of hornets, accidental (unintentional), subsequent encounter: Secondary | ICD-10-CM | POA: Diagnosis not present

## 2022-10-31 DIAGNOSIS — T63461D Toxic effect of venom of wasps, accidental (unintentional), subsequent encounter: Secondary | ICD-10-CM | POA: Diagnosis not present

## 2022-10-31 DIAGNOSIS — T63451D Toxic effect of venom of hornets, accidental (unintentional), subsequent encounter: Secondary | ICD-10-CM | POA: Diagnosis not present

## 2022-10-31 DIAGNOSIS — T63441D Toxic effect of venom of bees, accidental (unintentional), subsequent encounter: Secondary | ICD-10-CM | POA: Diagnosis not present

## 2022-11-18 DIAGNOSIS — J029 Acute pharyngitis, unspecified: Secondary | ICD-10-CM | POA: Diagnosis not present

## 2022-11-18 DIAGNOSIS — Z681 Body mass index (BMI) 19 or less, adult: Secondary | ICD-10-CM | POA: Diagnosis not present

## 2022-11-18 DIAGNOSIS — R051 Acute cough: Secondary | ICD-10-CM | POA: Diagnosis not present

## 2022-11-18 DIAGNOSIS — U071 COVID-19: Secondary | ICD-10-CM | POA: Diagnosis not present

## 2022-11-29 DIAGNOSIS — T63451D Toxic effect of venom of hornets, accidental (unintentional), subsequent encounter: Secondary | ICD-10-CM | POA: Diagnosis not present

## 2022-11-29 DIAGNOSIS — T63441D Toxic effect of venom of bees, accidental (unintentional), subsequent encounter: Secondary | ICD-10-CM | POA: Diagnosis not present

## 2022-11-29 DIAGNOSIS — T63461D Toxic effect of venom of wasps, accidental (unintentional), subsequent encounter: Secondary | ICD-10-CM | POA: Diagnosis not present

## 2022-12-13 DIAGNOSIS — J3081 Allergic rhinitis due to animal (cat) (dog) hair and dander: Secondary | ICD-10-CM | POA: Diagnosis not present

## 2022-12-13 DIAGNOSIS — J3089 Other allergic rhinitis: Secondary | ICD-10-CM | POA: Diagnosis not present

## 2022-12-13 DIAGNOSIS — H1045 Other chronic allergic conjunctivitis: Secondary | ICD-10-CM | POA: Diagnosis not present

## 2022-12-13 DIAGNOSIS — T63451D Toxic effect of venom of hornets, accidental (unintentional), subsequent encounter: Secondary | ICD-10-CM | POA: Diagnosis not present

## 2022-12-13 DIAGNOSIS — J301 Allergic rhinitis due to pollen: Secondary | ICD-10-CM | POA: Diagnosis not present

## 2022-12-20 DIAGNOSIS — T63451D Toxic effect of venom of hornets, accidental (unintentional), subsequent encounter: Secondary | ICD-10-CM | POA: Diagnosis not present

## 2022-12-28 DIAGNOSIS — T63451D Toxic effect of venom of hornets, accidental (unintentional), subsequent encounter: Secondary | ICD-10-CM | POA: Diagnosis not present

## 2022-12-28 DIAGNOSIS — T63461D Toxic effect of venom of wasps, accidental (unintentional), subsequent encounter: Secondary | ICD-10-CM | POA: Diagnosis not present

## 2022-12-28 DIAGNOSIS — T63441D Toxic effect of venom of bees, accidental (unintentional), subsequent encounter: Secondary | ICD-10-CM | POA: Diagnosis not present

## 2023-01-09 DIAGNOSIS — E785 Hyperlipidemia, unspecified: Secondary | ICD-10-CM | POA: Diagnosis not present

## 2023-01-09 DIAGNOSIS — R040 Epistaxis: Secondary | ICD-10-CM | POA: Diagnosis not present

## 2023-01-09 DIAGNOSIS — Z5181 Encounter for therapeutic drug level monitoring: Secondary | ICD-10-CM | POA: Diagnosis not present

## 2023-01-09 DIAGNOSIS — E559 Vitamin D deficiency, unspecified: Secondary | ICD-10-CM | POA: Diagnosis not present

## 2023-01-09 DIAGNOSIS — D649 Anemia, unspecified: Secondary | ICD-10-CM | POA: Diagnosis not present

## 2023-01-09 DIAGNOSIS — Z0000 Encounter for general adult medical examination without abnormal findings: Secondary | ICD-10-CM | POA: Diagnosis not present

## 2023-01-29 DIAGNOSIS — T63441D Toxic effect of venom of bees, accidental (unintentional), subsequent encounter: Secondary | ICD-10-CM | POA: Diagnosis not present

## 2023-01-29 DIAGNOSIS — T63461D Toxic effect of venom of wasps, accidental (unintentional), subsequent encounter: Secondary | ICD-10-CM | POA: Diagnosis not present

## 2023-01-29 DIAGNOSIS — T63451D Toxic effect of venom of hornets, accidental (unintentional), subsequent encounter: Secondary | ICD-10-CM | POA: Diagnosis not present

## 2023-02-27 DIAGNOSIS — T63441D Toxic effect of venom of bees, accidental (unintentional), subsequent encounter: Secondary | ICD-10-CM | POA: Diagnosis not present

## 2023-02-27 DIAGNOSIS — T63461D Toxic effect of venom of wasps, accidental (unintentional), subsequent encounter: Secondary | ICD-10-CM | POA: Diagnosis not present

## 2023-02-27 DIAGNOSIS — T63451D Toxic effect of venom of hornets, accidental (unintentional), subsequent encounter: Secondary | ICD-10-CM | POA: Diagnosis not present

## 2023-04-02 DIAGNOSIS — T63441D Toxic effect of venom of bees, accidental (unintentional), subsequent encounter: Secondary | ICD-10-CM | POA: Diagnosis not present

## 2023-04-02 DIAGNOSIS — T63461D Toxic effect of venom of wasps, accidental (unintentional), subsequent encounter: Secondary | ICD-10-CM | POA: Diagnosis not present

## 2023-04-02 DIAGNOSIS — T63451D Toxic effect of venom of hornets, accidental (unintentional), subsequent encounter: Secondary | ICD-10-CM | POA: Diagnosis not present

## 2023-04-30 DIAGNOSIS — T63461D Toxic effect of venom of wasps, accidental (unintentional), subsequent encounter: Secondary | ICD-10-CM | POA: Diagnosis not present

## 2023-04-30 DIAGNOSIS — T63451D Toxic effect of venom of hornets, accidental (unintentional), subsequent encounter: Secondary | ICD-10-CM | POA: Diagnosis not present

## 2023-04-30 DIAGNOSIS — T63441D Toxic effect of venom of bees, accidental (unintentional), subsequent encounter: Secondary | ICD-10-CM | POA: Diagnosis not present

## 2023-05-07 DIAGNOSIS — W5501XA Bitten by cat, initial encounter: Secondary | ICD-10-CM | POA: Diagnosis not present

## 2023-05-07 DIAGNOSIS — S61431A Puncture wound without foreign body of right hand, initial encounter: Secondary | ICD-10-CM | POA: Diagnosis not present

## 2023-05-10 DIAGNOSIS — L989 Disorder of the skin and subcutaneous tissue, unspecified: Secondary | ICD-10-CM | POA: Diagnosis not present

## 2023-05-10 DIAGNOSIS — S60571D Other superficial bite of hand of right hand, subsequent encounter: Secondary | ICD-10-CM | POA: Diagnosis not present

## 2023-05-25 DIAGNOSIS — H6991 Unspecified Eustachian tube disorder, right ear: Secondary | ICD-10-CM | POA: Diagnosis not present

## 2023-05-25 DIAGNOSIS — H73891 Other specified disorders of tympanic membrane, right ear: Secondary | ICD-10-CM | POA: Diagnosis not present

## 2023-05-30 DIAGNOSIS — T63461D Toxic effect of venom of wasps, accidental (unintentional), subsequent encounter: Secondary | ICD-10-CM | POA: Diagnosis not present

## 2023-05-30 DIAGNOSIS — T63451D Toxic effect of venom of hornets, accidental (unintentional), subsequent encounter: Secondary | ICD-10-CM | POA: Diagnosis not present

## 2023-05-30 DIAGNOSIS — T63441D Toxic effect of venom of bees, accidental (unintentional), subsequent encounter: Secondary | ICD-10-CM | POA: Diagnosis not present

## 2023-06-01 DIAGNOSIS — L03113 Cellulitis of right upper limb: Secondary | ICD-10-CM | POA: Diagnosis not present

## 2023-06-08 DIAGNOSIS — M79641 Pain in right hand: Secondary | ICD-10-CM | POA: Diagnosis not present

## 2023-06-08 DIAGNOSIS — W5501XA Bitten by cat, initial encounter: Secondary | ICD-10-CM | POA: Diagnosis not present

## 2023-06-08 DIAGNOSIS — R2231 Localized swelling, mass and lump, right upper limb: Secondary | ICD-10-CM | POA: Diagnosis not present

## 2023-06-10 DIAGNOSIS — M79641 Pain in right hand: Secondary | ICD-10-CM | POA: Diagnosis not present

## 2023-06-12 DIAGNOSIS — L814 Other melanin hyperpigmentation: Secondary | ICD-10-CM | POA: Diagnosis not present

## 2023-06-12 DIAGNOSIS — L821 Other seborrheic keratosis: Secondary | ICD-10-CM | POA: Diagnosis not present

## 2023-06-12 DIAGNOSIS — D225 Melanocytic nevi of trunk: Secondary | ICD-10-CM | POA: Diagnosis not present

## 2023-06-12 DIAGNOSIS — L815 Leukoderma, not elsewhere classified: Secondary | ICD-10-CM | POA: Diagnosis not present

## 2023-06-19 ENCOUNTER — Encounter (HOSPITAL_COMMUNITY): Payer: Self-pay | Admitting: Orthopedic Surgery

## 2023-06-19 NOTE — Progress Notes (Signed)
Melinda Wilkinson denies chest pain or shortness of breath.  Patient denies having any s/s of Covid in her household, also denies any known exposure to Covid. Ms Tomasik denies  any s/s of upper or lower respiratory infection in the past 8 weeks.   Ms Okino' PCP is Jorge Ny with [facility].

## 2023-06-20 ENCOUNTER — Ambulatory Visit (HOSPITAL_COMMUNITY): Payer: BC Managed Care – PPO | Admitting: Anesthesiology

## 2023-06-20 ENCOUNTER — Ambulatory Visit (HOSPITAL_COMMUNITY)
Admission: RE | Admit: 2023-06-20 | Discharge: 2023-06-20 | Disposition: A | Discharge status: Home / Self Care | Payer: BC Managed Care – PPO | Source: Ambulatory Visit | Attending: Orthopedic Surgery | Admitting: Orthopedic Surgery

## 2023-06-20 ENCOUNTER — Encounter (HOSPITAL_COMMUNITY)
Admission: RE | Disposition: A | Discharge status: Home / Self Care | Payer: Self-pay | Source: Ambulatory Visit | Attending: Orthopedic Surgery

## 2023-06-20 ENCOUNTER — Other Ambulatory Visit: Payer: Self-pay

## 2023-06-20 ENCOUNTER — Encounter (HOSPITAL_COMMUNITY): Payer: Self-pay | Admitting: Orthopedic Surgery

## 2023-06-20 DIAGNOSIS — R2231 Localized swelling, mass and lump, right upper limb: Secondary | ICD-10-CM | POA: Diagnosis not present

## 2023-06-20 DIAGNOSIS — S63400A Traumatic rupture of unspecified ligament of right index finger at metacarpophalangeal and interphalangeal joint, initial encounter: Secondary | ICD-10-CM | POA: Diagnosis not present

## 2023-06-20 DIAGNOSIS — W5501XA Bitten by cat, initial encounter: Secondary | ICD-10-CM | POA: Insufficient documentation

## 2023-06-20 DIAGNOSIS — S61252A Open bite of right middle finger without damage to nail, initial encounter: Secondary | ICD-10-CM | POA: Diagnosis not present

## 2023-06-20 DIAGNOSIS — M797 Fibromyalgia: Secondary | ICD-10-CM | POA: Insufficient documentation

## 2023-06-20 HISTORY — PX: INCISION AND DRAINAGE OF WOUND: SHX1803

## 2023-06-20 LAB — BASIC METABOLIC PANEL
Anion gap: 12 (ref 5–15)
BUN: 8 mg/dL (ref 6–20)
CO2: 23 mmol/L (ref 22–32)
Calcium: 9.2 mg/dL (ref 8.9–10.3)
Chloride: 102 mmol/L (ref 98–111)
Creatinine, Ser: 0.79 mg/dL (ref 0.44–1.00)
GFR, Estimated: >60 mL/min (ref >60)
Glucose, Bld: 103 mg/dL — ABNORMAL HIGH (ref 70–99)
Potassium: 3.2 mmol/L — ABNORMAL LOW (ref 3.5–5.1)
Sodium: 137 mmol/L (ref 135–145)

## 2023-06-20 LAB — CBC
HCT: 34.1 % — ABNORMAL LOW (ref 36.0–46.0)
Hemoglobin: 11.4 g/dL — ABNORMAL LOW (ref 12.0–15.0)
MCH: 30.6 pg (ref 26.0–34.0)
MCHC: 33.4 g/dL (ref 30.0–36.0)
MCV: 91.7 fL (ref 80.0–100.0)
Platelets: 189 10*3/uL (ref 150–400)
RBC: 3.72 MIL/uL — ABNORMAL LOW (ref 3.87–5.11)
RDW: 11.9 % (ref 11.5–15.5)
WBC: 3.1 10*3/uL — ABNORMAL LOW (ref 4.0–10.5)
nRBC: 0 % (ref 0.0–0.2)

## 2023-06-20 SURGERY — IRRIGATION AND DEBRIDEMENT WOUND
Anesthesia: Monitor Anesthesia Care | Site: Middle Finger | Laterality: Right

## 2023-06-20 MED ORDER — OXYCODONE HCL 5 MG PO TABS: 5.0000 mg | ORAL_TABLET | Freq: Once | ORAL | Status: DC | PRN

## 2023-06-20 MED ORDER — OXYCODONE HCL 5 MG PO TABS
5.0000 mg | ORAL_TABLET | Freq: Four times a day (QID) | ORAL | 0 refills | Status: AC | PRN
Start: 2023-06-20 — End: 2023-06-27

## 2023-06-20 MED ORDER — ORAL CARE MOUTH RINSE: 15.0000 mL | Freq: Once | OROMUCOSAL | Status: AC

## 2023-06-20 MED ORDER — CEFAZOLIN SODIUM-DEXTROSE 2-4 GM/100ML-% IV SOLN
2.0000 g | INTRAVENOUS | Status: AC
  Administered 2023-06-20: 2 g via INTRAVENOUS
  Filled 2023-06-20: qty 100

## 2023-06-20 MED ORDER — ONDANSETRON HCL 4 MG/2ML IJ SOLN
INTRAMUSCULAR | Status: DC | PRN
  Administered 2023-06-20: 4 mg via INTRAVENOUS

## 2023-06-20 MED ORDER — BUPIVACAINE HCL (PF) 0.25 % IJ SOLN
INTRAMUSCULAR | Status: AC
  Filled 2023-06-20: qty 30

## 2023-06-20 MED ORDER — ONDANSETRON HCL 4 MG/2ML IJ SOLN
INTRAMUSCULAR | Status: AC
  Filled 2023-06-20: qty 2

## 2023-06-20 MED ORDER — 0.9 % SODIUM CHLORIDE (POUR BTL) OPTIME
TOPICAL | Status: DC | PRN
  Administered 2023-06-20: 1000 mL

## 2023-06-20 MED ORDER — AMOXICILLIN-POT CLAVULANATE 500-125 MG PO TABS
1.0000 | ORAL_TABLET | Freq: Three times a day (TID) | ORAL | 0 refills | Status: AC
Start: 2023-06-20 — End: 2023-06-27

## 2023-06-20 MED ORDER — MIDAZOLAM HCL 2 MG/2ML IJ SOLN
INTRAMUSCULAR | Status: AC
  Filled 2023-06-20: qty 2

## 2023-06-20 MED ORDER — LACTATED RINGERS IV SOLN: INTRAVENOUS | Status: DC

## 2023-06-20 MED ORDER — CHLORHEXIDINE GLUCONATE 0.12 % MT SOLN: 15.0000 mL | Freq: Once | OROMUCOSAL | Status: AC

## 2023-06-20 MED ORDER — FENTANYL CITRATE (PF) 100 MCG/2ML IJ SOLN: 25.0000 ug | INTRAMUSCULAR | Status: DC | PRN

## 2023-06-20 MED ORDER — KETAMINE HCL 50 MG/5ML IJ SOSY
PREFILLED_SYRINGE | INTRAMUSCULAR | Status: AC
  Filled 2023-06-20: qty 5

## 2023-06-20 MED ORDER — PROPOFOL 1000 MG/100ML IV EMUL
INTRAVENOUS | Status: AC
  Filled 2023-06-20: qty 100

## 2023-06-20 MED ORDER — CHLORHEXIDINE GLUCONATE 0.12 % MT SOLN
OROMUCOSAL | Status: AC
  Administered 2023-06-20: 15 mL via OROMUCOSAL
  Filled 2023-06-20: qty 15

## 2023-06-20 MED ORDER — OXYCODONE HCL 5 MG/5ML PO SOLN: 5.0000 mg | Freq: Once | ORAL | Status: DC | PRN

## 2023-06-20 MED ORDER — BUPIVACAINE HCL (PF) 0.25 % IJ SOLN
INTRAMUSCULAR | Status: DC | PRN
  Administered 2023-06-20: 10 mL

## 2023-06-20 MED ORDER — MIDAZOLAM HCL 2 MG/2ML IJ SOLN
INTRAMUSCULAR | Status: DC | PRN
  Administered 2023-06-20: 2 mg via INTRAVENOUS

## 2023-06-20 MED ORDER — PROPOFOL 10 MG/ML IV BOLUS
INTRAVENOUS | Status: DC | PRN
  Administered 2023-06-20 (×2): 50 mg via INTRAVENOUS

## 2023-06-20 MED ORDER — ONDANSETRON HCL 4 MG/2ML IJ SOLN: 4.0000 mg | Freq: Once | INTRAMUSCULAR | Status: DC | PRN

## 2023-06-20 MED ORDER — PROPOFOL 500 MG/50ML IV EMUL
INTRAVENOUS | Status: DC | PRN
  Administered 2023-06-20: 75 ug/kg/min via INTRAVENOUS

## 2023-06-20 SURGICAL SUPPLY — 70 items
APL PRP STRL LF DISP 70% ISPRP (MISCELLANEOUS)
BAG COUNTER SPONGE SURGICOUNT (BAG) ×1 IMPLANT
BAG SPNG CNTER NS LX DISP (BAG) ×1
BNDG CMPR 5X2 KNTD ELC UNQ LF (GAUZE/BANDAGES/DRESSINGS) ×1
BNDG CMPR 5X3 KNIT ELC UNQ LF (GAUZE/BANDAGES/DRESSINGS)
BNDG CMPR 75X21 PLY HI ABS (MISCELLANEOUS)
BNDG CMPR 9X4 STRL LF SNTH (GAUZE/BANDAGES/DRESSINGS)
BNDG ELASTIC 2INX 5YD STR LF (GAUZE/BANDAGES/DRESSINGS) IMPLANT
BNDG ELASTIC 2X5.8 VLCR STR LF (GAUZE/BANDAGES/DRESSINGS) ×1 IMPLANT
BNDG ELASTIC 3INX 5YD STR LF (GAUZE/BANDAGES/DRESSINGS) ×1 IMPLANT
BNDG ELASTIC 4X5.8 VLCR STR LF (GAUZE/BANDAGES/DRESSINGS) ×1 IMPLANT
BNDG ESMARK 4X9 LF (GAUZE/BANDAGES/DRESSINGS) IMPLANT
BNDG GAUZE DERMACEA FLUFF 4 (GAUZE/BANDAGES/DRESSINGS) ×2 IMPLANT
BNDG GZE DERMACEA 4 6PLY (GAUZE/BANDAGES/DRESSINGS)
CHLORAPREP W/TINT 26 (MISCELLANEOUS) ×1 IMPLANT
CORD BIPOLAR FORCEPS 12FT (ELECTRODE) ×1 IMPLANT
COVER BACK TABLE 60X90IN (DRAPES) IMPLANT
COVER MAYO STAND STRL (DRAPES) ×1 IMPLANT
COVER SURGICAL LIGHT HANDLE (MISCELLANEOUS) ×1 IMPLANT
CUFF TOURN SGL QUICK 18X4 (TOURNIQUET CUFF) ×1 IMPLANT
CUFF TOURN SGL QUICK 24 (TOURNIQUET CUFF)
CUFF TRNQT CYL 24X4X16.5-23 (TOURNIQUET CUFF) IMPLANT
DRAIN PENROSE 18X1/4 LTX STRL (DRAIN) IMPLANT
DRAPE HALF SHEET 40X57 (DRAPES) ×1 IMPLANT
DRAPE OEC MINIVIEW 54X84 (DRAPES) IMPLANT
DRAPE SURG 17X23 STRL (DRAPES) ×1 IMPLANT
DRSG ADAPTIC 3X8 NADH LF (GAUZE/BANDAGES/DRESSINGS) ×1 IMPLANT
DRSG XEROFORM 1X8 (GAUZE/BANDAGES/DRESSINGS) IMPLANT
GAUZE SPONGE 4X4 12PLY STRL (GAUZE/BANDAGES/DRESSINGS) ×1 IMPLANT
GAUZE STRETCH 2X75IN STRL (MISCELLANEOUS) IMPLANT
GAUZE XEROFORM 1X8 LF (GAUZE/BANDAGES/DRESSINGS) ×1 IMPLANT
GLOVE BIO SURGEON STRL SZ7 (GLOVE) ×1 IMPLANT
GLOVE SURG UNDER POLY LF SZ7 (GLOVE) ×1 IMPLANT
GOWN STRL REUS W/ TWL XL LVL3 (GOWN DISPOSABLE) ×2 IMPLANT
GOWN STRL REUS W/TWL XL LVL3 (GOWN DISPOSABLE) ×2
IV CATH 18G X1.75 CATHLON (IV SOLUTION) IMPLANT
KIT BASIN OR (CUSTOM PROCEDURE TRAY) ×1 IMPLANT
KIT TURNOVER KIT B (KITS) ×1 IMPLANT
LOOP VASCLR MAXI BLUE 18IN ST (MISCELLANEOUS) IMPLANT
LOOP VASCULAR MAXI 18 BLUE (MISCELLANEOUS)
MANIFOLD NEPTUNE II (INSTRUMENTS) IMPLANT
NDL HYPO 25GX1X1/2 BEV (NEEDLE) IMPLANT
NDL HYPO 25X1 1.5 SAFETY (NEEDLE) ×1 IMPLANT
NDL KEITH (NEEDLE) IMPLANT
NEEDLE HYPO 25GX1X1/2 BEV (NEEDLE) ×1
NEEDLE HYPO 25X1 1.5 SAFETY (NEEDLE)
NEEDLE KEITH (NEEDLE)
NS IRRIG 1000ML POUR BTL (IV SOLUTION) ×1 IMPLANT
PACK ORTHO EXTREMITY (CUSTOM PROCEDURE TRAY) ×1 IMPLANT
PAD ABD 8X10 STRL (GAUZE/BANDAGES/DRESSINGS) ×1 IMPLANT
PAD ARMBOARD 7.5X6 YLW CONV (MISCELLANEOUS) ×1 IMPLANT
PAD CAST 4YDX4 CTTN HI CHSV (CAST SUPPLIES) ×2 IMPLANT
PADDING CAST ABS COTTON 3X4 (CAST SUPPLIES) ×1 IMPLANT
PADDING CAST COTTON 4X4 STRL (CAST SUPPLIES)
SPLINT PLASTER CAST XFAST 3X15 (CAST SUPPLIES) ×1 IMPLANT
SPONGE T-LAP 4X18 ~~LOC~~+RFID (SPONGE) ×1 IMPLANT
SUT FIBERWIRE 3-0 18 TAPR NDL (SUTURE)
SUT VICRYL RAPIDE 4/0 PS 2 (SUTURE) IMPLANT
SUTURE FIBERWR 3-0 18 TAPR NDL (SUTURE) IMPLANT
SWAB CULTURE ESWAB REG 1ML (MISCELLANEOUS) IMPLANT
SYR 20ML LL LF (SYRINGE) IMPLANT
SYR BULB EAR ULCER 3OZ GRN STR (SYRINGE) ×1 IMPLANT
SYR CONTROL 10ML LL (SYRINGE) IMPLANT
TOWEL GREEN STERILE (TOWEL DISPOSABLE) ×1 IMPLANT
TOWEL GREEN STERILE FF (TOWEL DISPOSABLE) ×2 IMPLANT
TUBE CONNECTING 12X1/4 (SUCTIONS) IMPLANT
UNDERPAD 30X36 HEAVY ABSORB (UNDERPADS AND DIAPERS) ×1 IMPLANT
VASCULAR TIE MAXI BLUE 18IN ST (MISCELLANEOUS)
WATER STERILE IRR 1000ML POUR (IV SOLUTION) ×1 IMPLANT
YANKAUER SUCT BULB TIP NO VENT (SUCTIONS) IMPLANT

## 2023-06-20 NOTE — Anesthesia Preprocedure Evaluation (Addendum)
Anesthesia Evaluation  Patient identified by MRN, date of birth, ID band Patient awake    History of Anesthesia Complications (+) PONV and history of anesthetic complications  Airway Mallampati: II  TM Distance: >3 FB     Dental no notable dental hx.    Pulmonary neg COPD   breath sounds clear to auscultation       Cardiovascular (-) hypertension(-) angina (-) CAD, (-) Past MI, (-) Cardiac Stents, (-) CABG and (-) CHF (-) pacemaker(-) Cardiac Defibrillator (-) Valvular Problems/Murmurs Rhythm:Regular Rate:Normal     Neuro/Psych neg Seizures    GI/Hepatic ,neg GERD  ,,(+) neg Cirrhosis        Endo/Other  neg diabetes    Renal/GU Renal disease     Musculoskeletal  (+) Arthritis ,  Fibromyalgia -  Abdominal   Peds  Hematology  (+) Blood dyscrasia, anemia   Anesthesia Other Findings   Reproductive/Obstetrics                             Anesthesia Physical Anesthesia Plan  ASA: 2  Anesthesia Plan: MAC   Post-op Pain Management:    Induction: Intravenous  PONV Risk Score and Plan: 1 and Ondansetron and Propofol infusion  Airway Management Planned:   Additional Equipment:   Intra-op Plan:   Post-operative Plan:   Informed Consent: I have reviewed the patients History and Physical, chart, labs and discussed the procedure including the risks, benefits and alternatives for the proposed anesthesia with the patient or authorized representative who has indicated his/her understanding and acceptance.     Dental advisory given  Plan Discussed with: CRNA  Anesthesia Plan Comments:         Anesthesia Quick Evaluation

## 2023-06-20 NOTE — H&P (Signed)
HAND SURGERY   HPI: Patient is a 57 y.o. female who presents with a cat bite to the right middle finger over the MCPJ.  She's had persistent pain and swelling since the bite one month ago.  MRI suggested joint arthrotomy but without large effusion or abscess.  Given her persistent symptoms that have failed abx, she presents today for I&D.  Patient denies any changes to their medical history or new systemic symptoms today.    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   TYMPANOSTOMY TUBE PLACEMENT  as child   Social History   Socioeconomic History   Marital status: Divorced    Spouse name: Not on file   Number of children: Not on file   Years of education: Not on file   Highest education level: Not on file  Occupational History   Occupation: Electronics engineer: [employer]  Tobacco Use   Smoking status: Never   Smokeless tobacco: Never  Vaping Use   Vaping status: Never Used  Substance and Sexual Activity   Alcohol use: No   Drug use: No   Sexual activity: Yes    Birth control/protection: Pill  Other Topics Concern   Not on file  Social History Narrative   HSG   Married [id number] 10 yrs divorced   Social Determinants of Health   Financial Resource Strain: Not on file  Food Insecurity: Low Risk  (05/25/2023)   Received from [facility]   Hunger Vital Sign    Worried About Running Out of Food in the Last Year: Never true    Ran Out of Food in the Last Year: Never true  Transportation Needs: Not on file  Physical Activity: Not on file  Stress: Not on file  Social Connections: Not on file   Family History  Problem Relation Age of  Onset   Hyperlipidemia Mother    Hypertension Father    Cancer Neg Hx        breast or colon   Diabetes Neg Hx    Coronary artery disease Neg Hx    - negative except otherwise stated in the family history section Allergies  Allergen Reactions   Bee Venom Itching, Other (See Comments) and Swelling   Nitrofurantoin     REACTION: Flu like symptoms   Prior to Admission medications   Medication Sig Start Date End Date Taking? Authorizing Provider  acetaminophen (TYLENOL) 500 MG tablet Take 500-1,000 mg by mouth every 6 (six) hours as needed for moderate pain, mild pain or headache.   Yes [provider]  fluticasone (FLONASE) 50 MCG/ACT nasal spray Place 2 sprays into both nostrils daily. 04/03/22  Yes [provider]  moxifloxacin (AVELOX) 400 MG tablet Take 400 mg by mouth daily. 06/12/23  Yes [provider]  pravastatin (PRAVACHOL) 20 MG tablet Take 20 mg by mouth daily. 05/18/21  Yes [provider]   No results found. - Positive ROS: All other systems have been reviewed and were otherwise negative with the exception of those mentioned in the HPI and as above.  Physical Exam: General: No acute distress,  resting comfortably Cardiovascular: BUE warm and well perfused, normal rate Respiratory: Normal WOB on RA Skin: Warm and dry Neurologic: Sensation intact distally Psychiatric: Patient is at baseline mood and affect  Right Upper Extremity  Moderate focal swelling of the right middle finger MCPJ. Mild overlying erythema.  Small puncture wound still visible.  Full AROM of middle finger at MCP, PIP, and DIP joints but with subjective tighness.  SILT throughout hand.  Hand warm and well perfused w/ BCR.    Assessment: 25 yo F w/ cat bite to the right middle finger MCPJ with persistent swelling and erythema concerning for persistent infection.   Plan: OR today for I&D of right middle finger MCPJ. We again reviewed the risks of surgery which include,  but are not limited to, bleeding, infection, damage to neurovascular structures, persistent symptoms, stiffness, need for additional surgery.  Informed consent was signed.  All questions were answered.   Marlyne Beards, M.D. [facility] 3:45 PM

## 2023-06-20 NOTE — Op Note (Signed)
Date of Surgery: 06/20/2023  INDICATIONS: Patient is a 57 y.o.-year-old female with persistent swelling, pain, and erythema of the right middle finger MCPJ following a cat bite a month or so ago.  An MRI suggested a traumatic arthrotomy but without abscess or evidence of osteomyelitis.  Given her failure to fully improved with prolonged antibiotics, we discussed irrigation and debridement of the MCP joint.  Risks, benefits, and alternatives to surgery were again discussed with the patient in the preoperative area. The patient wishes to proceed with surgery.  Informed consent was signed after our discussion.   PREOPERATIVE DIAGNOSIS:  Right middle finger traumatic arthrotomy of MCPJ  POSTOPERATIVE DIAGNOSIS: Same.  PROCEDURE:  Right middle finger MCPJ arthrotomy with irrigation (16109)   SURGEON: Waylan Rocher, M.D.  ASSIST: None  ANESTHESIA:  Local, MAC  IV FLUIDS AND URINE: See anesthesia.  ESTIMATED BLOOD LOSS: <5 mL.  IMPLANTS: * No implants in log *   DRAINS: None  COMPLICATIONS: None  DESCRIPTION OF PROCEDURE: The patient was met in the preoperative holding area where the surgical site was marked and the informed consent form was signed.  The patient was then brought back to the operating room and remained on the stretcher.  A hand table was placed adjacent to the operative extremity and locked into place.  A tourniquet was placed on the right forearm.  A formal timeout was performed to confirm that this was the correct patient, surgical side, surgical site, and surgical procedure.  All were present and in agreement. Following formal timeout, a local block was performed using 10 cc of 0.25% plain marcaine. The right upper extremity was then prepped and draped in the usual and sterile fashion.   Following formal timeout, the limb was exsanguinated by gravity and the tourniquet inflated to 200 mmHg.  I made a longitudinal incision along the ulnar aspect of the middle finger MCP  joint in the area of maximum induration and the previous puncture wound.  The skin was incised.  Blunt dissection with a tenotomy scissor was used to identify the extensor apparatus.  The extensor and sagittal band were then elevated and retracted distally to gain access to the MCP joint.  A longitudinal capsulotomy was performed.  There was no purulence within the joint.  The articular cartilage of the head of the metacarpal and the visible portion of the proximal phalanx were pristine.  The MCP joint was thoroughly irrigated with sterile saline using a small Angiocath.  Following thorough irrigation, the extensor apparatus was then placed back into its normal anatomic position.  The wound was then closed using a 4-0 Vicryl Rapide suture in horizontal mattress fashion.  The tourniquet was deflated.  Hemostasis was achieved with direct pressure over the wound.  Wound was dressed with Xeroform, 4 x 4 gauze, Kling wrap, and a small Ace wrap.  The patient was reversed from sedation.  They were transferred from the operating table to the postoperative bed.  All counts were correct x 2 at the end of the procedure.  The patient was then taken to the PACU in stable condition.   POSTOPERATIVE PLAN: Will be discharged home with appropriate pain medication and discharge instructions.  I will start her on another course of oral antibiotics.  A referral be made to hand therapy to begin working on immediate active range of motion exercises to prevent stiffness.  I will see her back in the office in 10 to 14 days for her first postop visit.  Leonette Most  Frazier Butt, MD 6:05 PM

## 2023-06-20 NOTE — Discharge Instructions (Signed)
Melinda Wilkinson, M.D. Hand Surgery  POST-OPERATIVE DISCHARGE INSTRUCTIONS   PRESCRIPTIONS: You may have been given a prescription to be taken as directed for post-operative pain control.  You may also take over the counter ibuprofen/aleve and tylenol for pain. Take this as directed on the packaging. Do not exceed 3000 mg tylenol/acetaminophen in 24 hours.  Ibuprofen 600-800 mg (3-4) tablets by mouth every 6 hours as needed for pain.  OR Aleve 2 tablets by mouth every 12 hours (twice daily) as needed for pain.  AND/OR Tylenol 1000 mg (2 tablets) every 8 hours as needed for pain.  Please use your pain medication carefully, as refills are limited and you may not be provided with one.  As stated above, please use over the counter pain medicine - it will also be helpful with decreasing your swelling.    ANESTHESIA: After your surgery, post-surgical discomfort or pain is likely. This discomfort can last several days to a few weeks. At certain times of the day your discomfort may be more intense.   Did you receive a nerve block?  A nerve block can provide pain relief for one hour to two days after your surgery. As long as the nerve block is working, you will experience little or no sensation in the area the surgeon operated on.  As the nerve block wears off, you will begin to experience pain or discomfort. It is very important that you begin taking your prescribed pain medication before the nerve block fully wears off. Treating your pain at the first sign of the block wearing off will ensure your pain is better controlled and more tolerable when full-sensation returns. Do not wait until the pain is intolerable, as the medicine will be less effective. It is better to treat pain in advance than to try and catch up.   General Anesthesia:  If you did not receive a nerve block during your surgery, you will need to start taking your pain medication shortly after your surgery and should continue  to do so as prescribed by your surgeon.     ICE AND ELEVATION: You may use ice for the first 48-72 hours, but it is not critical.   Motion of your fingers is very important to decrease the swelling.  Elevation, as much as possible for the next 48 hours, is critical for decreasing swelling as well as for pain relief. Elevation means when you are seated or lying down, you hand should be at or above your heart. When walking, the hand needs to be at or above the level of your elbow.  If the bandage gets too tight, it may need to be loosened. Please contact our office and we will instruct you in how to do this.    SURGICAL BANDAGES:  Keep your dressing and/or splint clean and dry at all times.  Do not remove until you are seen again in the office.  If careful, you may place a plastic bag over your bandage and tape the end to shower, but be careful, do not get your bandages wet.     HAND THERAPY:  You will be contacted to set up your first therapy visit.    ACTIVITY AND WORK: You are encouraged to move any fingers which are not in the bandage.  Light use of the fingers is allowed to assist the other hand with daily hygiene and eating, but strong gripping or lifting is often uncomfortable and should be avoided.  You might miss a variable  period of time from work and hopefully this issue has been discussed prior to surgery. You may not do any heavy work with your affected hand for about 2 weeks.    [facility] [address] Floor, [address] [place], [place] [address] [phone]

## 2023-06-20 NOTE — Interval H&P Note (Signed)
History and Physical Interval Note:  06/20/2023 3:47 PM  Melinda Wilkinson  has presented today for surgery, with the diagnosis of Right middle finger infection.  The various methods of treatment have been discussed with the patient and family. After consideration of risks, benefits and other options for treatment, the patient has consented to  Procedure(s) with comments: IRRIGATION AND DEBRIDEMENT OF RIGHT MIDDLE FINGER WITH METACARPOPHALANGEAL JOINT ARTHROTOMY (Right) - local 45 as a surgical intervention.  The patient's history has been reviewed, patient examined, no change in status, stable for surgery.  I have reviewed the patient's chart and labs.  Questions were answered to the patient's satisfaction.     Gaetan Spieker Deziyah Arvin

## 2023-06-21 ENCOUNTER — Encounter (HOSPITAL_COMMUNITY): Payer: Self-pay | Admitting: Orthopedic Surgery

## 2023-06-21 NOTE — Transfer of Care (Signed)
Immediate Anesthesia Transfer of Care Note  Patient: Melinda Wilkinson  Procedure(s) Performed: IRRIGATION AND DEBRIDEMENT OF RIGHT MIDDLE FINGER WITH METACARPOPHALANGEAL JOINT ARTHROTOMY (Right: Middle Finger)  Patient Location: PACU  Anesthesia Type:MAC  Level of Consciousness: awake  Airway & Oxygen Therapy: Patient Spontanous Breathing  Post-op Assessment: Report given to RN  Post vital signs: Reviewed and stable  Last Vitals:  Vitals Value Taken Time  BP 129/68 06/20/23 1830  Temp 37 C 06/20/23 1805  Pulse 64 06/20/23 1838  Resp 17 06/20/23 1838  SpO2 98 % 06/20/23 1838  Vitals shown include unfiled device data.  Last Pain:  Vitals:   06/20/23 1830  TempSrc:   PainSc: 0-No pain         Complications: No notable events documented.

## 2023-06-21 NOTE — Anesthesia Postprocedure Evaluation (Signed)
Anesthesia Post Note  Patient: Melinda Wilkinson  Procedure(s) Performed: IRRIGATION AND DEBRIDEMENT OF RIGHT MIDDLE FINGER WITH METACARPOPHALANGEAL JOINT ARTHROTOMY (Right: Middle Finger)     Patient location during evaluation: PACU Anesthesia Type: MAC Level of consciousness: awake and alert Pain management: pain level controlled Vital Signs Assessment: post-procedure vital signs reviewed and stable Respiratory status: spontaneous breathing, nonlabored ventilation and respiratory function stable Cardiovascular status: stable and blood pressure returned to baseline Anesthetic complications: no   No notable events documented.  Last Vitals:  Vitals:   06/20/23 1815 06/20/23 1830  BP: 117/68 129/68  Pulse: (!) 59 (!) 57  Resp: 17 18  Temp:    SpO2: 97% 100%    Last Pain:  Vitals:   06/20/23 1830  TempSrc:   PainSc: 0-No pain   Pain Goal:                   [name]

## 2023-06-27 DIAGNOSIS — M25641 Stiffness of right hand, not elsewhere classified: Secondary | ICD-10-CM | POA: Diagnosis not present

## 2023-07-02 DIAGNOSIS — J3089 Other allergic rhinitis: Secondary | ICD-10-CM | POA: Diagnosis not present

## 2023-07-02 DIAGNOSIS — J301 Allergic rhinitis due to pollen: Secondary | ICD-10-CM | POA: Diagnosis not present

## 2023-07-02 DIAGNOSIS — J3081 Allergic rhinitis due to animal (cat) (dog) hair and dander: Secondary | ICD-10-CM | POA: Diagnosis not present

## 2023-07-31 DIAGNOSIS — T63451D Toxic effect of venom of hornets, accidental (unintentional), subsequent encounter: Secondary | ICD-10-CM | POA: Diagnosis not present

## 2023-07-31 DIAGNOSIS — T63441D Toxic effect of venom of bees, accidental (unintentional), subsequent encounter: Secondary | ICD-10-CM | POA: Diagnosis not present

## 2023-07-31 DIAGNOSIS — T63421D Toxic effect of venom of ants, accidental (unintentional), subsequent encounter: Secondary | ICD-10-CM | POA: Diagnosis not present

## 2023-08-30 DIAGNOSIS — T63461D Toxic effect of venom of wasps, accidental (unintentional), subsequent encounter: Secondary | ICD-10-CM | POA: Diagnosis not present

## 2023-08-30 DIAGNOSIS — T63441D Toxic effect of venom of bees, accidental (unintentional), subsequent encounter: Secondary | ICD-10-CM | POA: Diagnosis not present

## 2023-08-30 DIAGNOSIS — T63451D Toxic effect of venom of hornets, accidental (unintentional), subsequent encounter: Secondary | ICD-10-CM | POA: Diagnosis not present

## 2023-09-25 DIAGNOSIS — R2231 Localized swelling, mass and lump, right upper limb: Secondary | ICD-10-CM | POA: Diagnosis not present

## 2023-09-25 DIAGNOSIS — S61252A Open bite of right middle finger without damage to nail, initial encounter: Secondary | ICD-10-CM | POA: Diagnosis not present

## 2023-09-27 DIAGNOSIS — Z124 Encounter for screening for malignant neoplasm of cervix: Secondary | ICD-10-CM | POA: Diagnosis not present

## 2023-09-27 DIAGNOSIS — Z681 Body mass index (BMI) 19 or less, adult: Secondary | ICD-10-CM | POA: Diagnosis not present

## 2023-09-27 DIAGNOSIS — R319 Hematuria, unspecified: Secondary | ICD-10-CM | POA: Diagnosis not present

## 2023-09-27 DIAGNOSIS — Z01419 Encounter for gynecological examination (general) (routine) without abnormal findings: Secondary | ICD-10-CM | POA: Diagnosis not present

## 2023-09-27 DIAGNOSIS — Z1231 Encounter for screening mammogram for malignant neoplasm of breast: Secondary | ICD-10-CM | POA: Diagnosis not present

## 2023-10-02 DIAGNOSIS — T63461D Toxic effect of venom of wasps, accidental (unintentional), subsequent encounter: Secondary | ICD-10-CM | POA: Diagnosis not present

## 2023-10-02 DIAGNOSIS — T63441D Toxic effect of venom of bees, accidental (unintentional), subsequent encounter: Secondary | ICD-10-CM | POA: Diagnosis not present

## 2023-10-02 DIAGNOSIS — T63451D Toxic effect of venom of hornets, accidental (unintentional), subsequent encounter: Secondary | ICD-10-CM | POA: Diagnosis not present

## 2023-10-31 DIAGNOSIS — T63461D Toxic effect of venom of wasps, accidental (unintentional), subsequent encounter: Secondary | ICD-10-CM | POA: Diagnosis not present

## 2023-10-31 DIAGNOSIS — T63441D Toxic effect of venom of bees, accidental (unintentional), subsequent encounter: Secondary | ICD-10-CM | POA: Diagnosis not present

## 2023-10-31 DIAGNOSIS — T63451D Toxic effect of venom of hornets, accidental (unintentional), subsequent encounter: Secondary | ICD-10-CM | POA: Diagnosis not present

## 2023-12-03 DIAGNOSIS — T63441D Toxic effect of venom of bees, accidental (unintentional), subsequent encounter: Secondary | ICD-10-CM | POA: Diagnosis not present

## 2023-12-03 DIAGNOSIS — T63461D Toxic effect of venom of wasps, accidental (unintentional), subsequent encounter: Secondary | ICD-10-CM | POA: Diagnosis not present

## 2023-12-03 DIAGNOSIS — T63451D Toxic effect of venom of hornets, accidental (unintentional), subsequent encounter: Secondary | ICD-10-CM | POA: Diagnosis not present

## 2023-12-24 DIAGNOSIS — J301 Allergic rhinitis due to pollen: Secondary | ICD-10-CM | POA: Diagnosis not present

## 2023-12-24 DIAGNOSIS — J3089 Other allergic rhinitis: Secondary | ICD-10-CM | POA: Diagnosis not present

## 2023-12-24 DIAGNOSIS — J3081 Allergic rhinitis due to animal (cat) (dog) hair and dander: Secondary | ICD-10-CM | POA: Diagnosis not present

## 2023-12-24 DIAGNOSIS — H1045 Other chronic allergic conjunctivitis: Secondary | ICD-10-CM | POA: Diagnosis not present

## 2023-12-31 DIAGNOSIS — J301 Allergic rhinitis due to pollen: Secondary | ICD-10-CM | POA: Diagnosis not present

## 2023-12-31 DIAGNOSIS — J3089 Other allergic rhinitis: Secondary | ICD-10-CM | POA: Diagnosis not present

## 2023-12-31 DIAGNOSIS — J3081 Allergic rhinitis due to animal (cat) (dog) hair and dander: Secondary | ICD-10-CM | POA: Diagnosis not present

## 2024-01-01 DIAGNOSIS — Z13820 Encounter for screening for osteoporosis: Secondary | ICD-10-CM | POA: Diagnosis not present

## 2024-01-11 DIAGNOSIS — R799 Abnormal finding of blood chemistry, unspecified: Secondary | ICD-10-CM | POA: Diagnosis not present

## 2024-01-11 DIAGNOSIS — Z0000 Encounter for general adult medical examination without abnormal findings: Secondary | ICD-10-CM | POA: Diagnosis not present

## 2024-01-11 DIAGNOSIS — E785 Hyperlipidemia, unspecified: Secondary | ICD-10-CM | POA: Diagnosis not present

## 2024-01-11 DIAGNOSIS — D649 Anemia, unspecified: Secondary | ICD-10-CM | POA: Diagnosis not present

## 2024-01-11 DIAGNOSIS — R2231 Localized swelling, mass and lump, right upper limb: Secondary | ICD-10-CM | POA: Diagnosis not present

## 2024-01-11 DIAGNOSIS — E559 Vitamin D deficiency, unspecified: Secondary | ICD-10-CM | POA: Diagnosis not present

## 2024-01-29 DIAGNOSIS — T63451D Toxic effect of venom of hornets, accidental (unintentional), subsequent encounter: Secondary | ICD-10-CM | POA: Diagnosis not present

## 2024-01-29 DIAGNOSIS — T63461D Toxic effect of venom of wasps, accidental (unintentional), subsequent encounter: Secondary | ICD-10-CM | POA: Diagnosis not present

## 2024-01-29 DIAGNOSIS — T63441D Toxic effect of venom of bees, accidental (unintentional), subsequent encounter: Secondary | ICD-10-CM | POA: Diagnosis not present

## 2024-02-13 DIAGNOSIS — M79644 Pain in right finger(s): Secondary | ICD-10-CM | POA: Diagnosis not present

## 2024-02-18 DIAGNOSIS — R799 Abnormal finding of blood chemistry, unspecified: Secondary | ICD-10-CM | POA: Diagnosis not present

## 2024-04-01 DIAGNOSIS — T63441D Toxic effect of venom of bees, accidental (unintentional), subsequent encounter: Secondary | ICD-10-CM | POA: Diagnosis not present

## 2024-04-01 DIAGNOSIS — T63451D Toxic effect of venom of hornets, accidental (unintentional), subsequent encounter: Secondary | ICD-10-CM | POA: Diagnosis not present

## 2024-04-01 DIAGNOSIS — T63461D Toxic effect of venom of wasps, accidental (unintentional), subsequent encounter: Secondary | ICD-10-CM | POA: Diagnosis not present

## 2024-04-08 DIAGNOSIS — T63461D Toxic effect of venom of wasps, accidental (unintentional), subsequent encounter: Secondary | ICD-10-CM | POA: Diagnosis not present

## 2024-04-08 DIAGNOSIS — T63441D Toxic effect of venom of bees, accidental (unintentional), subsequent encounter: Secondary | ICD-10-CM | POA: Diagnosis not present

## 2024-04-08 DIAGNOSIS — T63451D Toxic effect of venom of hornets, accidental (unintentional), subsequent encounter: Secondary | ICD-10-CM | POA: Diagnosis not present

## 2024-04-15 DIAGNOSIS — T63451D Toxic effect of venom of hornets, accidental (unintentional), subsequent encounter: Secondary | ICD-10-CM | POA: Diagnosis not present

## 2024-04-15 DIAGNOSIS — T63441D Toxic effect of venom of bees, accidental (unintentional), subsequent encounter: Secondary | ICD-10-CM | POA: Diagnosis not present

## 2024-04-15 DIAGNOSIS — T63461D Toxic effect of venom of wasps, accidental (unintentional), subsequent encounter: Secondary | ICD-10-CM | POA: Diagnosis not present

## 2024-04-22 DIAGNOSIS — T63461D Toxic effect of venom of wasps, accidental (unintentional), subsequent encounter: Secondary | ICD-10-CM | POA: Diagnosis not present

## 2024-04-22 DIAGNOSIS — T63451D Toxic effect of venom of hornets, accidental (unintentional), subsequent encounter: Secondary | ICD-10-CM | POA: Diagnosis not present

## 2024-04-22 DIAGNOSIS — T63441D Toxic effect of venom of bees, accidental (unintentional), subsequent encounter: Secondary | ICD-10-CM | POA: Diagnosis not present

## 2024-04-29 DIAGNOSIS — T63451D Toxic effect of venom of hornets, accidental (unintentional), subsequent encounter: Secondary | ICD-10-CM | POA: Diagnosis not present

## 2024-04-29 DIAGNOSIS — T63461D Toxic effect of venom of wasps, accidental (unintentional), subsequent encounter: Secondary | ICD-10-CM | POA: Diagnosis not present

## 2024-04-29 DIAGNOSIS — T63441D Toxic effect of venom of bees, accidental (unintentional), subsequent encounter: Secondary | ICD-10-CM | POA: Diagnosis not present

## 2024-05-14 DIAGNOSIS — H6991 Unspecified Eustachian tube disorder, right ear: Secondary | ICD-10-CM | POA: Diagnosis not present

## 2024-05-26 DIAGNOSIS — H73891 Other specified disorders of tympanic membrane, right ear: Secondary | ICD-10-CM | POA: Diagnosis not present

## 2024-05-26 DIAGNOSIS — H6991 Unspecified Eustachian tube disorder, right ear: Secondary | ICD-10-CM | POA: Diagnosis not present

## 2024-05-29 DIAGNOSIS — T63461D Toxic effect of venom of wasps, accidental (unintentional), subsequent encounter: Secondary | ICD-10-CM | POA: Diagnosis not present

## 2024-05-29 DIAGNOSIS — T63441D Toxic effect of venom of bees, accidental (unintentional), subsequent encounter: Secondary | ICD-10-CM | POA: Diagnosis not present

## 2024-05-29 DIAGNOSIS — T63451D Toxic effect of venom of hornets, accidental (unintentional), subsequent encounter: Secondary | ICD-10-CM | POA: Diagnosis not present

## 2024-06-17 DIAGNOSIS — H73891 Other specified disorders of tympanic membrane, right ear: Secondary | ICD-10-CM | POA: Diagnosis not present

## 2024-06-17 DIAGNOSIS — H6991 Unspecified Eustachian tube disorder, right ear: Secondary | ICD-10-CM | POA: Diagnosis not present

## 2024-06-17 DIAGNOSIS — H6993 Unspecified Eustachian tube disorder, bilateral: Secondary | ICD-10-CM | POA: Diagnosis not present

## 2024-06-27 DIAGNOSIS — B351 Tinea unguium: Secondary | ICD-10-CM | POA: Diagnosis not present

## 2024-06-27 DIAGNOSIS — L820 Inflamed seborrheic keratosis: Secondary | ICD-10-CM | POA: Diagnosis not present

## 2024-06-27 DIAGNOSIS — L538 Other specified erythematous conditions: Secondary | ICD-10-CM | POA: Diagnosis not present

## 2024-06-27 DIAGNOSIS — L814 Other melanin hyperpigmentation: Secondary | ICD-10-CM | POA: Diagnosis not present

## 2024-06-27 DIAGNOSIS — L439 Lichen planus, unspecified: Secondary | ICD-10-CM | POA: Diagnosis not present

## 2024-06-27 DIAGNOSIS — L821 Other seborrheic keratosis: Secondary | ICD-10-CM | POA: Diagnosis not present

## 2024-06-27 DIAGNOSIS — D485 Neoplasm of uncertain behavior of skin: Secondary | ICD-10-CM | POA: Diagnosis not present

## 2024-06-30 DIAGNOSIS — T63451D Toxic effect of venom of hornets, accidental (unintentional), subsequent encounter: Secondary | ICD-10-CM | POA: Diagnosis not present

## 2024-06-30 DIAGNOSIS — T63441D Toxic effect of venom of bees, accidental (unintentional), subsequent encounter: Secondary | ICD-10-CM | POA: Diagnosis not present

## 2024-06-30 DIAGNOSIS — T63461D Toxic effect of venom of wasps, accidental (unintentional), subsequent encounter: Secondary | ICD-10-CM | POA: Diagnosis not present

## 2024-07-30 DIAGNOSIS — T63451D Toxic effect of venom of hornets, accidental (unintentional), subsequent encounter: Secondary | ICD-10-CM | POA: Diagnosis not present

## 2024-07-30 DIAGNOSIS — T63441D Toxic effect of venom of bees, accidental (unintentional), subsequent encounter: Secondary | ICD-10-CM | POA: Diagnosis not present

## 2024-07-30 DIAGNOSIS — T63461D Toxic effect of venom of wasps, accidental (unintentional), subsequent encounter: Secondary | ICD-10-CM | POA: Diagnosis not present

## 2024-08-29 DIAGNOSIS — T63451D Toxic effect of venom of hornets, accidental (unintentional), subsequent encounter: Secondary | ICD-10-CM | POA: Diagnosis not present

## 2024-08-29 DIAGNOSIS — T63441D Toxic effect of venom of bees, accidental (unintentional), subsequent encounter: Secondary | ICD-10-CM | POA: Diagnosis not present

## 2024-08-29 DIAGNOSIS — T63461D Toxic effect of venom of wasps, accidental (unintentional), subsequent encounter: Secondary | ICD-10-CM | POA: Diagnosis not present

## 2024-09-29 DIAGNOSIS — T63461D Toxic effect of venom of wasps, accidental (unintentional), subsequent encounter: Secondary | ICD-10-CM | POA: Diagnosis not present

## 2024-09-29 DIAGNOSIS — T63441D Toxic effect of venom of bees, accidental (unintentional), subsequent encounter: Secondary | ICD-10-CM | POA: Diagnosis not present

## 2024-09-29 DIAGNOSIS — T63451D Toxic effect of venom of hornets, accidental (unintentional), subsequent encounter: Secondary | ICD-10-CM | POA: Diagnosis not present
